# Patient Record
Sex: Male | Born: 1955 | Race: Black or African American | Hispanic: No | Marital: Single | Smoking: Never smoker
Health system: Southern US, Community
[De-identification: ages and names within clinical notes are randomized; demographics above are authoritative.]

## PROBLEM LIST (undated history)

## (undated) DIAGNOSIS — R768 Other specified abnormal immunological findings in serum: Secondary | ICD-10-CM

## (undated) DIAGNOSIS — B182 Chronic viral hepatitis C: Secondary | ICD-10-CM

## (undated) DIAGNOSIS — M1990 Unspecified osteoarthritis, unspecified site: Secondary | ICD-10-CM

## (undated) DIAGNOSIS — F1910 Other psychoactive substance abuse, uncomplicated: Secondary | ICD-10-CM

## (undated) HISTORY — DX: Unspecified osteoarthritis, unspecified site: M19.90

## (undated) HISTORY — PX: NO PAST SURGERIES: SHX2092

## (undated) HISTORY — DX: Other specified abnormal immunological findings in serum: R76.8

## (undated) HISTORY — DX: Chronic viral hepatitis C: B18.2

## (undated) HISTORY — DX: Other psychoactive substance abuse, uncomplicated: F19.10

---

## 2019-08-19 ENCOUNTER — Encounter: Payer: Self-pay | Admitting: Internal Medicine

## 2019-09-04 ENCOUNTER — Encounter: Payer: Self-pay | Admitting: Internal Medicine

## 2019-09-04 ENCOUNTER — Encounter: Payer: Self-pay | Admitting: Nurse Practitioner

## 2019-09-04 ENCOUNTER — Other Ambulatory Visit: Payer: Self-pay

## 2019-09-04 ENCOUNTER — Ambulatory Visit: Payer: Medicare PPO | Admitting: Nurse Practitioner

## 2019-09-04 DIAGNOSIS — K5900 Constipation, unspecified: Secondary | ICD-10-CM | POA: Diagnosis not present

## 2019-09-04 DIAGNOSIS — R768 Other specified abnormal immunological findings in serum: Secondary | ICD-10-CM | POA: Diagnosis not present

## 2019-09-04 NOTE — Progress Notes (Signed)
Primary Care Physician:  Emelda Fear, DO Primary Gastroenterologist:  Dr. Gala Romney  Chief Complaint  Patient presents with  . Hepatitis C  . Vomiting    HPI:   Cory Elliott is a 64 y.o. male who presents on referral from primary care for hepatitis C.  Reviewed information provided with referral including referral noting positive hepatitis C antibody.  Reviewed labs including CBC, CMP which were essentially normal other than mild transaminitis with AST/ALT elevated at 63/42.  Alk phos, bilirubin normal.  Hepatitis C antibody negative, hepatitis B core antibody and surface antigen both negative.  Hepatitis C antibody was positive.  Labs were marked for scanning.  No history of colonoscopy or endoscopy in our system.  No lab results in our system.  Today he states he states he just had a colonoscopy in [place], [place]. States he had "a test" with vomiting, which has improved. Has constipation and hard stools about every other day, often small stools. Not taking anything other than Milk of Magnesia. Did take Mag Citrate once. Denies other abdominal pain, N/V, hematochezia, melena, fever, chills, unintentional weight loss. Denies yellowing of skin/eyes, darkened urine, acute episodic confusion, generalized pruritis, tremors/shakes. Has taken 1-2 BC powders recently due to arthritis. Uses Ibuprofen "every now and then." Denies URI or flu-like symptoms. Denies loss of sense of taste or smell. He has received COVID-19 vaccine and has an appointment in 2 weeks for second dose. Denies chest pain, dyspnea, dizziness, lightheadedness, syncope, near syncope. Denies any other upper or lower GI symptoms.   Hepatitis C Risk Factors: Birth cohort ([place]): Yes IV drug use: No Tattoos: No Blood product transfusion: No HC worker: No Hemodialysis: No Maternal infection: No   Past Medical History:  Diagnosis Date  . Arthritis   . Hepatitis C antibody test positive   . Polysubstance abuse [facility])      Past Surgical History:  Procedure Laterality Date  . NO PAST SURGERIES      Current Outpatient Medications  Medication Sig Dispense Refill  . Aspirin-Caffeine (BC FAST PAIN RELIEF PO) Take by mouth as needed.    Marland Kitchen ibuprofen (ADVIL) 200 MG tablet Take 200 mg by mouth every 6 (six) hours as needed.    . tadalafil (CIALIS) 20 MG tablet Take 20 mg by mouth daily as needed for erectile dysfunction.     No current facility-administered medications for this visit.    Allergies as of 09/04/2019  . (No Known Allergies)    Family History  Problem Relation Age of Onset  . Colon cancer Neg Hx     Social History   Socioeconomic History  . Marital status: Single    Spouse name: Not on file  . Number of children: Not on file  . Years of education: Not on file  . Highest education level: Not on file  Occupational History  . Not on file  Tobacco Use  . Smoking status: Never Smoker  . Smokeless tobacco: Never Used  Substance and Sexual Activity  . Alcohol use: Yes    Comment: As of 09/04/19: 1-2 beers a week; previously more  . Drug use: Yes    Types: Marijuana, Cocaine    Comment: As of 09/04/19: 1-2 joints per day, cocaine 2-3 times per week  . Sexual activity: Not on file  Other Topics Concern  . Not on file  Social History Narrative  . Not on file   Social Determinants of Health   Financial Resource Strain:   .  Difficulty of Paying Living Expenses:   Food Insecurity:   . Worried About Charity fundraiser in the Last Year:   . Arboriculturist in the Last Year:   Transportation Needs:   . Film/video editor (Medical):   Marland Kitchen Lack of Transportation (Non-Medical):   Physical Activity:   . Days of Exercise per Week:   . Minutes of Exercise per Session:   Stress:   . Feeling of Stress :   Social Connections:   . Frequency of Communication with Friends and Family:   . Frequency of Social Gatherings with Friends and Family:   . Attends Religious Services:   . Active  Member of Clubs or Organizations:   . Attends Archivist Meetings:   Marland Kitchen Marital Status:   Intimate Partner Violence:   . Fear of Current or Ex-Partner:   . Emotionally Abused:   Marland Kitchen Physically Abused:   . Sexually Abused:     Subjective: Review of Systems  Constitutional: Negative for chills, fever, malaise/fatigue and weight loss.  HENT: Negative for congestion and sore throat.   Respiratory: Negative for cough and shortness of breath.   Cardiovascular: Negative for chest pain and palpitations.  Gastrointestinal: Negative for abdominal pain, blood in stool, diarrhea, melena, nausea and vomiting.  Musculoskeletal: Negative for joint pain and myalgias.  Skin: Negative for rash.  Neurological: Negative for dizziness and weakness.  Endo/Heme/Allergies: Does not bruise/bleed easily.  Psychiatric/Behavioral: Negative for depression. The patient is not nervous/anxious.   All other systems reviewed and are negative.      Objective: BP 140/87   Pulse 62   Temp (!) 97.3 F (36.3 C) (Oral)   Ht '5\' 7"'$  (1.702 m)   Wt 155 lb 12.8 oz (70.7 kg)   BMI 24.40 kg/m  Physical Exam Vitals and nursing note reviewed.  Constitutional:      General: He is not in acute distress.    Appearance: Normal appearance. He is not ill-appearing, toxic-appearing or diaphoretic.  HENT:     Head: Normocephalic and atraumatic.     Nose: No congestion or rhinorrhea.  Eyes:     General: No scleral icterus. Cardiovascular:     Rate and Rhythm: Normal rate and regular rhythm.     Heart sounds: Normal heart sounds.  Pulmonary:     Effort: Pulmonary effort is normal.     Breath sounds: Normal breath sounds.  Abdominal:     General: Bowel sounds are normal. There is no distension.     Palpations: Abdomen is soft. There is no hepatomegaly, splenomegaly or mass.     Tenderness: There is no abdominal tenderness. There is no guarding or rebound.     Hernia: No hernia is present.  Musculoskeletal:      Cervical back: Neck supple.  Skin:    General: Skin is warm and dry.     Coloration: Skin is not jaundiced.     Findings: No bruising or rash.  Neurological:     General: No focal deficit present.     Mental Status: He is alert and oriented to person, place, and time. Mental status is at baseline.  Psychiatric:        Mood and Affect: Mood normal.        Behavior: Behavior normal.        Thought Content: Thought content normal.       09/04/2019 2:36 PM   Disclaimer: This note was dictated with voice recognition software. Similar sounding  words can inadvertently be transcribed and may not be corrected upon review.

## 2019-09-04 NOTE — Assessment & Plan Note (Addendum)
The patient was referred by primary care for positive hepatitis C antibody.  Reviewed notes and labs from primary care.  Noted positive hepatitis C antibody but no genotype or RNA confirmation completed.  No ultrasound elastography.  Hepatitis a and B serologies were completed and are negative.  He has never been treated before.  I discussed the disease process of hepatitis C including pathophysiology, chances of success with oral treatments, and need to abstain from alcohol and drugs to the extent that it could cause "altered mental status" and result in forgetting a medication dose.  He verbalized understanding.  At this point I will complete serologic work-up including RNA confirmation, genotype, HIV serologies.  I will put in for abdominal ultrasound elastography to assess for fibrosis and/or cirrhosis.  At that point we can submit to insurance for possible coverage.  Follow-up in 6 weeks for further recommendations.  Hepatitis C Treatment Checklist  HCV RNA: ordered HCV GT: ordered Elastography: ordered Tx history: Never Treated Hepatitis A serologies: Yes, negative (see PCP labs marked for scanning) Hepatitis B serologies: Yes, negative (see PCP labs marked for scanning) HIV Serologies: ordered Contraindicated Medications: Assess at medication pickup, none currently Contraception: N/A

## 2019-09-04 NOTE — Patient Instructions (Signed)
Your health issues we discussed today were:   Mild constipation: 1. Start taking MiraLAX.  It is a powder that you mix into a drink of your choice and will dissolve to clear, odorless, tasteless. 2. Take MiraLAX once a day for regular bowel movements.  You can take a second dose daily, as needed, for any worsening constipation 3. Call us if you have any worsening or severe symptoms  Hepatitis C antibody positive: 1. Have your labs completed as soon as you can 2. We will help schedule your ultrasound for you 3. We will notify you of your results within 5 business days of them coming back 4. Further recommendations will follow, based on your results, as far as hepatitis C treatment 5. It is important that you work on abstaining from significant alcohol intake or drug use that could inhibit your decision making and result in missing a dose of medication  Overall I recommend:  1. Continue your other medications but try to avoid ibuprofen, Motrin, Advil, Aleve, naproxen, Naprosyn, aspirin, BC powders, Goody powders, and other "NSAIDs" that could result in worsening stomach problems 2. Return for follow-up in 6 weeks 3. Call us if you have any questions or concerns   ---------------------------------------------------------------  I am glad you got your vaccines!  Even though you are vaccinated you should continue to wear a mask, socially distance from people you do not live with, and wash your hands frequently  ---------------------------------------------------------------   At [facility] we value your feedback. You may receive a survey about your visit today. Please share your experience as we strive to create trusting relationships with our patients to provide genuine, compassionate, quality care.  We appreciate your understanding and patience as we review any laboratory studies, imaging, and other diagnostic tests that are ordered as we care for you. Our office policy is  5 business days for review of these results, and any emergent or urgent results are addressed in a timely manner for your best interest. If you do not hear from our office in 1 week, please contact us.   We also encourage the use of MyChart, which contains your medical information for your review as well. If you are not enrolled in this feature, an access code is on this after visit summary for your convenience. Thank you for allowing [place] to be involved in your care.  It was great to see you today!  I hope you have a great Summer!!

## 2019-09-04 NOTE — Assessment & Plan Note (Signed)
The patient notes mild constipation.  Has previously used mag citrate but does not want uses daily.  I recommended MiraLAX 1-2 times a day as needed for constipation.  Follow-up in 6 weeks.  Call for any worsening or severe symptoms.

## 2019-09-07 LAB — HIV ANTIBODY (ROUTINE TESTING W REFLEX): HIV 1&2 Ab, 4th Generation: NONREACTIVE

## 2019-09-07 LAB — HEPATITIS C GENOTYPE

## 2019-09-07 LAB — HEPATITIS C RNA QUANTITATIVE
HCV Quantitative Log: 6.02 Log IU/mL — ABNORMAL HIGH
HCV RNA, PCR, QN: 1040000 IU/mL — ABNORMAL HIGH

## 2019-09-09 ENCOUNTER — Ambulatory Visit (HOSPITAL_COMMUNITY)
Admission: RE | Admit: 2019-09-09 | Discharge: 2019-09-09 | Disposition: A | Discharge status: Home / Self Care | Payer: Medicare PPO | Source: Ambulatory Visit | Attending: Nurse Practitioner | Admitting: Nurse Practitioner

## 2019-09-09 ENCOUNTER — Other Ambulatory Visit: Payer: Self-pay

## 2019-09-09 DIAGNOSIS — R768 Other specified abnormal immunological findings in serum: Secondary | ICD-10-CM | POA: Diagnosis not present

## 2019-09-09 DIAGNOSIS — K5900 Constipation, unspecified: Secondary | ICD-10-CM | POA: Insufficient documentation

## 2019-09-19 ENCOUNTER — Telehealth: Payer: Self-pay

## 2019-09-19 NOTE — Telephone Encounter (Signed)
Approval letter was received from [facility] for Epclusa. Approval is good through 12/12/2019. Will notify pt when deliver is scheduled.

## 2019-09-24 NOTE — Telephone Encounter (Signed)
Patient received letter Cory Elliott was approved. He was checking to see if we have received it. I spoke with AM and it has not been received yet. Patient aware will call once shipment is received.

## 2019-09-24 NOTE — Telephone Encounter (Signed)
Noted  

## 2019-10-01 NOTE — Telephone Encounter (Signed)
Hep C medication has arrived. Please advise.  

## 2019-10-03 NOTE — Telephone Encounter (Signed)
Ok to have patient pick up medications and sign agreement as per below. No needed medications changes.    How to take Epclusa: 1. Do not stop taking Epclusa without first notifying us to discuss why you are wanting to stop taking Epclusa. 2. Take 1 Epclusa tablet, once a day, at the same time every day. 3. Your can take Epclusa with or without food. 4. Do not miss or skip any doses of Epclusa during treatment. 5. If you take too much Epclusa, notify us or go to the nearest emergency room.  DO NOT start any new medications, whether prescription or over-the-counter, no herbs, supplements, any other treatments before checking with [place] first.  Please have the patient sign the medication acknowledgement form when he comes to pick up his Epclusa.  Please schedule 4 week follow-up visit for labs and updated OV.

## 2019-10-03 NOTE — Telephone Encounter (Signed)
Spoke with pt. Pt will get medication at his 10/17/19 apt.

## 2019-10-17 ENCOUNTER — Other Ambulatory Visit: Payer: Self-pay

## 2019-10-17 ENCOUNTER — Ambulatory Visit (INDEPENDENT_AMBULATORY_CARE_PROVIDER_SITE_OTHER): Payer: Medicare PPO | Admitting: Nurse Practitioner

## 2019-10-17 ENCOUNTER — Encounter: Payer: Self-pay | Admitting: Nurse Practitioner

## 2019-10-17 VITALS — BP 160/95 | HR 64 | Temp 97.5°F | Ht 67.0 in | Wt 153.0 lb

## 2019-10-17 DIAGNOSIS — B182 Chronic viral hepatitis C: Secondary | ICD-10-CM | POA: Diagnosis not present

## 2019-10-17 DIAGNOSIS — K5900 Constipation, unspecified: Secondary | ICD-10-CM | POA: Diagnosis not present

## 2019-10-17 NOTE — Assessment & Plan Note (Addendum)
Completed work-up for hepatitis C (as noted below).  Cory Elliott has been shipped to our office.  We will give him his instructions for Epclusa as well as have him sign paperwork agreeing to not start any new medicines without checking with [place] first.  I will put in for CBC, CMP, hepatitis C RNA in 1 month.  I will request him be placed on the recall list to remind him to have his labs drawn in 1 month.  Plan for office visit follow-up in 3 months to include follow-up visit and labs.  I spent an extensive amount of time educating him on Epclusa and the need to call us for any questions, concerns, problems.  He verbalized understanding.  He is aware that insurance will likely not pay for a second course of treatment if he does not follow instructions.  I also advised him to refrain from marijuana, he is making his best effort at this time.  Hepatitis C Treatment Checklist  HCV RNA: 1,040,000 copies HCV GT: 1a Elastography: <5 kPa (high probability of normal) Tx history: Never Treated Hepatitis A serologies: Yes, negative (see PCP labs marked for scanning) Hepatitis B serologies: Yes, negative (see PCP labs marked for scanning) HIV Serologies: negative Contraindicated Medications: NONE (verified with UpToDate interaction program) Contraception: N/A

## 2019-10-17 NOTE — Assessment & Plan Note (Signed)
Constipation currently doing well.  He only gets constipated, per his description, if he does not drink enough water.  Recommend he drink at least 48 ounces of water daily.  Drink more water if he is going to be working in the sun.  Continue to use MiraLAX as needed for constipation.  Call for any worsening or severe symptoms.

## 2019-10-17 NOTE — Progress Notes (Signed)
Referring Provider: Lorelei Pont, DO Primary Care Physician:  Lorelei Pont, DO Primary GI:  Dr. Jena Gauss  Chief Complaint  Patient presents with  . Hepatitis C    stomach pain,fatigue    HPI:   Cory Elliott is a 64 y.o. male who presents for hepatitis C follow-up.  The patient was last seen in our office 09/04/2019 for hepatitis C and constipation.  Mildly elevated LFTs on PCP referral with AST/ALT elevated at 63/42, alkaline phosphatase and bilirubin normal.  Hepatitis C antibody positive, hepatitis B serologies negative.  At his last visit was previously having vomiting but this is improved.  Now constipation hard stools with a bowel movement every other day, only taking milk of magnesia.  Tried mag citrate but did not help.  No overt hepatic symptoms.  Notes 1-2 BC powders recently due to arthritis as well as ibuprofen "every now and then".  No other overt GI complaints.  Recommended updated labs for hepatitis C treatment options, right upper quadrant ultrasound elastography, MiraLAX daily or up to twice a day.  He was very prompt in obtaining his labs which confirmed hepatitis C infection with just over 1 million copies of RNA, genotype 1a, HIV negative.  Right upper quadrant ultrasound elastography without suggestion of cirrhosis.  Elastography less than 5K PA (high probability of being normal).  Prior Auth was initiated for [place] and approved.  This was shipped to our office and he will pick up his medication today.  Today he states he's feeling a bit bad today because he was out working in the sun a lot today. Has given up Marijuana a week ago. Constipation is somewhat improved. Has a bowel movement sometimes once a day, drinks minimal water. When he doesn't drink "at least 2-3 things of water my stools are hard." Denies ongoing abdominal pain. Has occasional cramp right ribs if he moves a certain way. Sometimes has a cramp in his right neck as well. Denies esophageal burning.  Denies N/V, hematochezia, melena, fever, chills, unintentional weight loss. Denies URI or flu-like symptoms. Denies loss of sense of taste or smell. The patient has received COVID-19 vaccination(s). Denies chest pain, dyspnea, dizziness, lightheadedness, syncope, near syncope. Denies any other upper or lower GI symptoms.  Took 2 BC powders yesterday due to arthritis pain (Tylenol didn't help); otherwise he hasn't had any ASA powders or NSAIDs recently.  A minimum of 25 min was spent on the patient's care today  Past Medical History:  Diagnosis Date  . Arthritis   . Chronic hepatitis C (HCC)   . Hepatitis C antibody test positive   . Polysubstance abuse [facility])     Past Surgical History:  Procedure Laterality Date  . NO PAST SURGERIES      Current Outpatient Medications  Medication Sig Dispense Refill  . Aspirin-Caffeine (BC FAST PAIN RELIEF PO) Take by mouth as needed.    Marland Kitchen ibuprofen (ADVIL) 200 MG tablet Take 200 mg by mouth as needed.     . tadalafil (CIALIS) 20 MG tablet Take 20 mg by mouth as needed for erectile dysfunction.      No current facility-administered medications for this visit.    Allergies as of 10/17/2019  . (No Known Allergies)    Family History  Problem Relation Age of Onset  . Colon cancer Neg Hx     Social History   Socioeconomic History  . Marital status: Single    Spouse name: Not on file  . Number of children: Not  on file  . Years of education: Not on file  . Highest education level: Not on file  Occupational History  . Not on file  Tobacco Use  . Smoking status: Never Smoker  . Smokeless tobacco: Never Used  Substance and Sexual Activity  . Alcohol use: Yes    Comment: As of 09/04/19: 1-2 beers a week; previously more  . Drug use: Not Currently    Types: Marijuana, Cocaine    Comment: Quit 2 wks ago as of  10/17/19, As of 09/04/19: 1-2 joints per day, cocaine 2-3 times per week  . Sexual activity: Not on file  Other Topics Concern  . Not  on file  Social History Narrative  . Not on file   Social Determinants of Health   Financial Resource Strain:   . Difficulty of Paying Living Expenses:   Food Insecurity:   . Worried About Charity fundraiser in the Last Year:   . Arboriculturist in the Last Year:   Transportation Needs:   . Film/video editor (Medical):   Marland Kitchen Lack of Transportation (Non-Medical):   Physical Activity:   . Days of Exercise per Week:   . Minutes of Exercise per Session:   Stress:   . Feeling of Stress :   Social Connections:   . Frequency of Communication with Friends and Family:   . Frequency of Social Gatherings with Friends and Family:   . Attends Religious Services:   . Active Member of Clubs or Organizations:   . Attends Archivist Meetings:   Marland Kitchen Marital Status:     Subjective: Review of Systems  Constitutional: Negative for chills, fever, malaise/fatigue and weight loss.  HENT: Negative for congestion and sore throat.   Respiratory: Negative for cough and shortness of breath.   Cardiovascular: Negative for chest pain and palpitations.  Gastrointestinal: Positive for constipation (mild if doesn't drink enough water). Negative for abdominal pain, blood in stool, diarrhea, melena, nausea and vomiting.  Musculoskeletal: Negative for joint pain and myalgias.  Skin: Negative for rash.  Neurological: Negative for dizziness and weakness.  Endo/Heme/Allergies: Does not bruise/bleed easily.  Psychiatric/Behavioral: Negative for depression. The patient is not nervous/anxious.   All other systems reviewed and are negative.    Objective: BP (!) 160/95   Pulse 64   Temp (!) 97.5 F (36.4 C) (Temporal)   Ht 5\' 7"  (1.702 m)   Wt 153 lb (69.4 kg)   BMI 23.96 kg/m  Physical Exam Vitals and nursing note reviewed.  Constitutional:      General: He is not in acute distress.    Appearance: Normal appearance. He is not ill-appearing, toxic-appearing or diaphoretic.  HENT:     Head:  Normocephalic and atraumatic.     Nose: No congestion or rhinorrhea.  Eyes:     General: No scleral icterus. Cardiovascular:     Rate and Rhythm: Normal rate and regular rhythm.     Heart sounds: Normal heart sounds.  Pulmonary:     Effort: Pulmonary effort is normal.     Breath sounds: Normal breath sounds.  Abdominal:     General: Bowel sounds are normal. There is no distension.     Palpations: Abdomen is soft. There is no hepatomegaly, splenomegaly or mass.     Tenderness: There is no abdominal tenderness. There is no guarding or rebound.     Hernia: No hernia is present.  Musculoskeletal:     Cervical back: Neck supple.  Skin:  General: Skin is warm and dry.     Coloration: Skin is not jaundiced.     Findings: No bruising or rash.  Neurological:     General: No focal deficit present.     Mental Status: He is alert and oriented to person, place, and time. Mental status is at baseline.  Psychiatric:        Mood and Affect: Mood normal.        Behavior: Behavior normal.        Thought Content: Thought content normal.       10/17/2019 2:53 PM   Disclaimer: This note was dictated with voice recognition software. Similar sounding words can inadvertently be transcribed and may not be corrected upon review.

## 2019-10-17 NOTE — Patient Instructions (Addendum)
Your health issues we discussed today were:   Constipation: 1. Aim to drink at least 48 ounces of water a day 2. You can continue to use MiraLAX 1-2 times a day as needed for constipation 3. Call us if you have any worsening or severe symptoms  Hepatitis C: 1. We will give you your Epclusa medication.  We will need you to sign paperwork agreement not to take any new medicines (over-the-counter, prescription, herbs, supplements, ANY medications) without checking with [place] first. 2. Take Epclusa once a day, with or without food, at the same time every day. See your instructions below 3. If you have any problems or side effects call us 4. If you take more than 1 dose in a day, call us 5. I have put in lab test that you will need to have completed in 1 month.  We will try to call and remind you to have these completed, but it is your responsibility to have your labs done 6. Call us for any questions or problems  Overall I recommend:  1. Continue your other current medications 2. Return for follow-up in 3 months 3. Call us if you have any questions or concerns.  ---------------------------------------------------------------  How to take Epclusa: 1. Do not stop taking Epclusa without first notifying us to discuss why you are wanting to stop taking Epclusa. 2. Take 1 Epclusa tablet, once a day, at the same time every day. 3. Your can take Epclusa with or without food. 4. Do not miss or skip any doses of Epclusa during treatment. 5. If you take too much Epclusa, notify us or go to the nearest emergency room.  DO NOT start any new medications, whether prescription or over-the-counter, no herbs, supplements, any other treatments before checking with [place] first.  ---------------------------------------------------------------  I am glad you have gotten your COVID-19 vaccination!  Even though you are fully vaccinated you should continue to follow CDC and state/local  guidelines.  ---------------------------------------------------------------   At [facility] we value your feedback. You may receive a survey about your visit today. Please share your experience as we strive to create trusting relationships with our patients to provide genuine, compassionate, quality care.  We appreciate your understanding and patience as we review any laboratory studies, imaging, and other diagnostic tests that are ordered as we care for you. Our office policy is 5 business days for review of these results, and any emergent or urgent results are addressed in a timely manner for your best interest. If you do not hear from our office in 1 week, please contact us.   We also encourage the use of MyChart, which contains your medical information for your review as well. If you are not enrolled in this feature, an access code is on this after visit summary for your convenience. Thank you for allowing [place] to be involved in your care.  It was great to see you today!  I hope you have a great Summer!!

## 2019-10-22 ENCOUNTER — Telehealth: Payer: Self-pay | Admitting: Internal Medicine

## 2019-10-22 NOTE — Telephone Encounter (Signed)
Patient called and said that his prescriptions were not sent in after his appointment last week

## 2019-10-22 NOTE — Telephone Encounter (Signed)
Lmom for pt to call us back. 

## 2019-10-23 NOTE — Telephone Encounter (Signed)
Pt wants to know if it is safe to take Tadalafil and Ibuprofen while he is taking Epclusa.  If he can't take Ibuprofen, he wants to know what he can take for pain.

## 2019-10-23 NOTE — Telephone Encounter (Signed)
These should not have any interactions. Thank him for calling to check!!!

## 2019-10-24 NOTE — Telephone Encounter (Signed)
Called pt and made him aware that Tadalafil and Ibuprofen should not have any interactions with Epclusa per EG.  Pt voiced understanding.

## 2020-01-21 ENCOUNTER — Other Ambulatory Visit: Payer: Self-pay

## 2020-01-21 ENCOUNTER — Ambulatory Visit: Payer: Medicare PPO | Admitting: Nurse Practitioner

## 2020-01-21 ENCOUNTER — Encounter: Payer: Self-pay | Admitting: Nurse Practitioner

## 2020-01-21 VITALS — BP 149/92 | HR 54 | Temp 97.5°F | Ht 66.0 in | Wt 157.4 lb

## 2020-01-21 DIAGNOSIS — B182 Chronic viral hepatitis C: Secondary | ICD-10-CM

## 2020-01-21 DIAGNOSIS — K5900 Constipation, unspecified: Secondary | ICD-10-CM

## 2020-01-21 NOTE — Patient Instructions (Signed)
Your health issues we discussed today were:   Constipation: 1. Continue using your stool softener as needed 2. Let us know if you have any worsening constipation or rectal bleeding  Chronic hepatitis C: 1. Congratulations on finishing your treatment! 2. Have your labs drawn today 3. We will have you follow-up in 3 months to recheck your labs in order to document "cure" which is defined as no viral genetic material present 3 months (12 weeks) after finishing treatment 4. Let us know if you have any recurrent symptoms  Overall I recommend:  1. Continue your other current medications 2. Return for follow-up in 3 months 3. Call us for any questions or concerns   ---------------------------------------------------------------  I am glad you have gotten your COVID-19 vaccination!  Even though you are fully vaccinated you should continue to follow CDC and state/local guidelines.  ---------------------------------------------------------------   At [facility] we value your feedback. You may receive a survey about your visit today. Please share your experience as we strive to create trusting relationships with our patients to provide genuine, compassionate, quality care.  We appreciate your understanding and patience as we review any laboratory studies, imaging, and other diagnostic tests that are ordered as we care for you. Our office policy is 5 business days for review of these results, and any emergent or urgent results are addressed in a timely manner for your best interest. If you do not hear from our office in 1 week, please contact us.   We also encourage the use of MyChart, which contains your medical information for your review as well. If you are not enrolled in this feature, an access code is on this after visit summary for your convenience. Thank you for allowing [place] to be involved in your care.  It was great to see you today!  I hope you have a great rest of your  summer!!

## 2020-01-21 NOTE — Progress Notes (Signed)
Referring Provider: Lorelei Pont, DO Primary Care Physician:  Lorelei Pont, DO Primary GI:  Dr. Jena Gauss   Chief Complaint  Patient presents with  . Hepatitis C    finished Epclusa-doing ok  . Constipation    occ    HPI:   Cory Elliott is a 65 y.o. male who presents for follow-up on hepatitis C.  The patient was last seen in our office 10/17/2019 for chronic hepatitis C and constipation.  Referred by primary care for hep C positive antibody with mild transaminitis.  Noted hepatitis B and HIV serologies negative.  Hepatitis C RNA confirmed with genotype 1a.  Right upper quadrant ultrasound with elastography noted to be less than 5 kPa (high probability of being normal).  We submitted prior Auth request for Epclusa which was approved and shipped to our office.  At his last visit he noted he was doing okay overall, but with some symptoms of feeling "not great" due to working out in the sun a lot.  Constipation somewhat improved with daily bowel movement, although he drinks minimal water.  No other overt GI complaints.  No NSAIDs other than 2 BC powders today prior due to arthritis pain.  Recommended minimum 48 ounces of water a day, more if will be in the sun.  Continue to use MiraLAX 1-2 times a day.  Start taking Epclusa once a day, with or without food.  Further instructions were provided.  Call for any concerns.  Recommended repeat labs in 1 month.  Follow-up in 3 months.  It does not appear that follow-up labs (CBC, CMP, hepatitis C RNA) scheduled for end of June were completed.  Today he states he is doing okay overall. He finished his Epclusa around 2 weeks ago. He tolerated the medication, didn't miss any doses. Denies abdominal pain, N/V, hematochezia, melena, fever, chills, unintentional weight loss. Denies URI or flu-like symptoms. Denies loss of sense of taste or smell. The patient has received COVID-19 vaccination(s). Denies chest pain, dyspnea, dizziness, lightheadedness, syncope,  near syncope. Denies any other upper or lower GI symptoms.  He notes occasional constipation, uses a stool softener occasionally (about once a week) and this resolves any symptoms he may be having.  Past Medical History:  Diagnosis Date  . Arthritis   . Chronic hepatitis C (HCC)   . Hepatitis C antibody test positive   . Polysubstance abuse [facility])     Past Surgical History:  Procedure Laterality Date  . NO PAST SURGERIES      Current Outpatient Medications  Medication Sig Dispense Refill  . ibuprofen (ADVIL) 200 MG tablet Take 200 mg by mouth as needed.     . tadalafil (CIALIS) 20 MG tablet Take 20 mg by mouth as needed for erectile dysfunction.      No current facility-administered medications for this visit.    Allergies as of 01/21/2020  . (No Known Allergies)    Family History  Problem Relation Age of Onset  . Colon cancer Neg Hx     Social History   Socioeconomic History  . Marital status: Single    Spouse name: Not on file  . Number of children: Not on file  . Years of education: Not on file  . Highest education level: Not on file  Occupational History  . Not on file  Tobacco Use  . Smoking status: Never Smoker  . Smokeless tobacco: Never Used  Substance and Sexual Activity  . Alcohol use: Yes    Comment: 01/21/20  1-2 beer/day  . Drug use: Yes    Frequency: 2.0 times per week    Types: Marijuana, Cocaine    Comment: 01/21/20: marijuana twice a week; occasional cocaine (once every 2-3 weeks)  . Sexual activity: Not on file  Other Topics Concern  . Not on file  Social History Narrative  . Not on file   Social Determinants of Health   Financial Resource Strain:   . Difficulty of Paying Living Expenses: Not on file  Food Insecurity:   . Worried About Programme researcher, broadcasting/film/video in the Last Year: Not on file  . Ran Out of Food in the Last Year: Not on file  Transportation Needs:   . Lack of Transportation (Medical): Not on file  . Lack of Transportation  (Non-Medical): Not on file  Physical Activity:   . Days of Exercise per Week: Not on file  . Minutes of Exercise per Session: Not on file  Stress:   . Feeling of Stress : Not on file  Social Connections:   . Frequency of Communication with Friends and Family: Not on file  . Frequency of Social Gatherings with Friends and Family: Not on file  . Attends Religious Services: Not on file  . Active Member of Clubs or Organizations: Not on file  . Attends Banker Meetings: Not on file  . Marital Status: Not on file    Subjective: Review of Systems  Constitutional: Negative for chills, fever, malaise/fatigue and weight loss.  HENT: Negative for congestion and sore throat.   Respiratory: Negative for cough and shortness of breath.   Cardiovascular: Negative for chest pain and palpitations.  Gastrointestinal: Negative for abdominal pain, blood in stool, diarrhea, melena, nausea and vomiting.  Musculoskeletal: Negative for joint pain and myalgias.  Skin: Negative for rash.  Neurological: Negative for dizziness and weakness.  Endo/Heme/Allergies: Does not bruise/bleed easily.  Psychiatric/Behavioral: Negative for depression. The patient is not nervous/anxious.   All other systems reviewed and are negative.    Objective: BP (!) 149/92   Pulse (!) 54   Temp (!) 97.5 F (36.4 C) (Temporal)   Ht 5\' 6"  (1.676 m)   Wt 157 lb 6.4 oz (71.4 kg)   BMI 25.41 kg/m  Physical Exam Vitals and nursing note reviewed.  Constitutional:      General: He is not in acute distress.    Appearance: Normal appearance. He is not ill-appearing, toxic-appearing or diaphoretic.  HENT:     Head: Normocephalic and atraumatic.     Nose: No congestion or rhinorrhea.  Eyes:     General: No scleral icterus. Cardiovascular:     Rate and Rhythm: Normal rate and regular rhythm.     Heart sounds: Normal heart sounds.  Pulmonary:     Effort: Pulmonary effort is normal.     Breath sounds: Normal breath  sounds.  Abdominal:     General: Bowel sounds are normal. There is no distension.     Palpations: Abdomen is soft. There is no hepatomegaly, splenomegaly or mass.     Tenderness: There is no abdominal tenderness. There is no guarding or rebound.     Hernia: No hernia is present.  Musculoskeletal:     Cervical back: Neck supple.  Skin:    General: Skin is warm and dry.     Coloration: Skin is not jaundiced.     Findings: No bruising or rash.  Neurological:     General: No focal deficit present.  Mental Status: He is alert and oriented to person, place, and time. Mental status is at baseline.  Psychiatric:        Mood and Affect: Mood normal.        Behavior: Behavior normal.        Thought Content: Thought content normal.      Assessment:  Very pleasant 64 year old male with chronic hepatitis C which is completed Epclusa treatment.  He states he took all of his medications, no side effects, but not miss a dose.  Unfortunately he did not have his 4-week post treatment initiation labs.  I will have them check end of treatment labs today.  I will bring him back in 3 months to touch base and check labs to document SVR-12.  His initial ultrasound elastography was unremarkable as per HPI so I do not think that a repeat ultrasound is necessary at this time.  He does continue to use drugs and we have had a good discussion about the need to reduce and stop using recreational drugs.  He verbalized understanding.  On his chief complaint today he also mention mild constipation.  On further discussion he typically requires a stool softener 1-2 times a week which is effective for him.  He does not feel he needs any other treatment.  I recommend he continue this.  Call for any worsening symptoms.   Plan: 1. Have labs drawn today: CBC, CMP, HCVRNA 2. Continue other current medications 3. Continue stool softener as needed for mild, intermittent constipation 4. Notify us of any worsening symptoms,  rectal bleeding, recalcitrant constipation. 5. Return for follow-up in 3 months for evaluation and to update labs/document SVR-12   Thank you for allowing [place] to participate in the care of Cory Fabio Neighbors, DNP, AGNP-C Adult & Gerontological Nurse Practitioner [facility]   01/21/2020 3:16 PM   Disclaimer: This note was dictated with voice recognition software. Similar sounding words can inadvertently be transcribed and may not be corrected upon review.

## 2020-01-27 LAB — COMPREHENSIVE METABOLIC PANEL
AG Ratio: 1.2 (calc) (ref 1.0–2.5)
ALT: 17 U/L (ref 9–46)
AST: 28 U/L (ref 10–35)
Albumin: 4.6 g/dL (ref 3.6–5.1)
Alkaline phosphatase (APISO): 82 U/L (ref 35–144)
BUN: 17 mg/dL (ref 7–25)
CO2: 27 mmol/L (ref 20–32)
Calcium: 9.6 mg/dL (ref 8.6–10.3)
Chloride: 103 mmol/L (ref 98–110)
Creat: 1.12 mg/dL (ref 0.70–1.25)
Globulin: 3.8 g/dL (calc) — ABNORMAL HIGH (ref 1.9–3.7)
Glucose, Bld: 83 mg/dL (ref 65–99)
Potassium: 4.2 mmol/L (ref 3.5–5.3)
Sodium: 138 mmol/L (ref 135–146)
Total Bilirubin: 0.6 mg/dL (ref 0.2–1.2)
Total Protein: 8.4 g/dL — ABNORMAL HIGH (ref 6.1–8.1)

## 2020-01-27 LAB — CBC WITH DIFFERENTIAL/PLATELET
Absolute Monocytes: 473 cells/uL (ref 200–950)
Basophils Absolute: 52 cells/uL (ref 0–200)
Basophils Relative: 1 %
Eosinophils Absolute: 218 cells/uL (ref 15–500)
Eosinophils Relative: 4.2 %
HCT: 35 % — ABNORMAL LOW (ref 38.5–50.0)
Hemoglobin: 11.6 g/dL — ABNORMAL LOW (ref 13.2–17.1)
Lymphs Abs: 2673 cells/uL (ref 850–3900)
MCH: 28.1 pg (ref 27.0–33.0)
MCHC: 33.1 g/dL (ref 32.0–36.0)
MCV: 84.7 fL (ref 80.0–100.0)
MPV: 11.6 fL (ref 7.5–12.5)
Monocytes Relative: 9.1 %
Neutro Abs: 1784 cells/uL (ref 1500–7800)
Neutrophils Relative %: 34.3 %
Platelets: 222 10*3/uL (ref 140–400)
RBC: 4.13 10*6/uL — ABNORMAL LOW (ref 4.20–5.80)
RDW: 14.1 % (ref 11.0–15.0)
Total Lymphocyte: 51.4 %
WBC: 5.2 10*3/uL (ref 3.8–10.8)

## 2020-01-27 LAB — HCV RNA, QUANT REAL-TIME PCR W/REFLEX
HCV RNA, PCR, QN (Log): <1.18 LogIU/mL
HCV RNA, PCR, QN: <15 IU/mL

## 2020-02-14 ENCOUNTER — Telehealth: Payer: Self-pay | Admitting: Internal Medicine

## 2020-02-14 NOTE — Telephone Encounter (Signed)
Spoke with pt. Pt states at his last apt, he was given a written prescription that he took to his doctor to prescribe. Pt says the medication is too expensive and his doctor asked him to call our office to see if there was anything else, he could try. I didn't see a prescription listed. Wynne Dust, PA, do you recall the name of the prescription pt is referring to?

## 2020-02-14 NOTE — Telephone Encounter (Signed)
Patient called and said the medication we sent in for his is very expensive, he needs an alternative

## 2020-02-14 NOTE — Telephone Encounter (Signed)
I dont see any new Rx from his OV a month ago (and his active med list is only Cialis and Ibuprofen, neither of which we prescribe). Did he possibly get it from another provider?

## 2020-02-14 NOTE — Telephone Encounter (Signed)
Lmom, waiting on a return call.  

## 2020-02-19 NOTE — Telephone Encounter (Signed)
Spoke with pt and he has been in contact with his PCP about the medication.

## 2020-04-22 ENCOUNTER — Ambulatory Visit: Payer: Medicare PPO | Admitting: Nurse Practitioner

## 2020-06-03 ENCOUNTER — Other Ambulatory Visit: Payer: Self-pay

## 2020-06-03 ENCOUNTER — Encounter: Payer: Self-pay | Admitting: Nurse Practitioner

## 2020-06-03 ENCOUNTER — Ambulatory Visit: Payer: Medicare PPO | Admitting: Nurse Practitioner

## 2020-06-03 VITALS — BP 145/78 | HR 80 | Temp 97.3°F | Ht 66.0 in | Wt 162.4 lb

## 2020-06-03 DIAGNOSIS — B182 Chronic viral hepatitis C: Secondary | ICD-10-CM

## 2020-06-03 DIAGNOSIS — K5900 Constipation, unspecified: Secondary | ICD-10-CM

## 2020-06-03 NOTE — Progress Notes (Signed)
Referring Provider: Lorelei Pont, DO Primary Care Physician:  Lorelei Pont, DO Primary GI:  Dr. Jena Gauss  Chief Complaint  Patient presents with  . Hepatitis C    F/u  . Constipation    Bowels are doing good    HPI:   Cory Elliott is a 65 y.o. male who presents for follow-up on hepatitis C and constipation.  The patient was last seen in our office 01/21/2020 for the same.  Initially referred for positive hepatitis C with mild transaminitis found to be genotype Ia with normal liver elastography.  At his last visit he noted he finished Epclusa 2 weeks prior to his last office visit.  He notes he tolerated the medicine well and did not miss any doses.  No overt GI or hepatic complaints other than occasional constipation for which he uses a stool softener (about once a week) which resolves his symptoms.  Recommended continue stool softener as needed, complete labs for end of treatment.  Schedule follow-up in 3 months to reevaluate and recheck labs, call if any problems.  Labs completed 01/21/2020 including CBC which shows mild anemia with a hemoglobin of 11.6 (baseline not available), CMP now with normalized LFTs, hepatitis C RNA not detected.  He had a 73-month follow-up scheduled for 04/22/2020, but this was canceled.  Today states he is doing okay overall. He is generally having a bowel movement every day. Occasionally goes every other day when he'll take Milk of Magnesia which works well. Overall he is satisfied with his results. Denies abdominal pain, N/V, hematochezia, melena, fever, chills, unintentional weight loss. Denies any yellowing of the skin/eyes, darkened urine, acute episodic confusion, generalized tremors, generalized pruritis. Denies URI or flu-like symptoms. Denies loss of sense of taste or smell. The patient has received COVID-19 vaccination(s). Denies chest pain, dyspnea, dizziness, lightheadedness, syncope, near syncope. Denies any other upper or lower GI symptoms.  Past  Medical History:  Diagnosis Date  . Arthritis   . Chronic hepatitis C (HCC)   . Hepatitis C antibody test positive   . Polysubstance abuse [facility])     Past Surgical History:  Procedure Laterality Date  . NO PAST SURGERIES      Current Outpatient Medications  Medication Sig Dispense Refill  . diclofenac (VOLTAREN) 75 MG EC tablet Take 75 mg by mouth 2 (two) times daily.    . tadalafil (CIALIS) 20 MG tablet Take 20 mg by mouth as needed for erectile dysfunction.     Marland Kitchen ibuprofen (ADVIL) 200 MG tablet Take 200 mg by mouth as needed.  (Patient not taking: Reported on 06/03/2020)     No current facility-administered medications for this visit.    Allergies as of 06/03/2020  . (No Known Allergies)    Family History  Problem Relation Age of Onset  . Colon cancer Neg Hx     Social History   Socioeconomic History  . Marital status: Single    Spouse name: Not on file  . Number of children: Not on file  . Years of education: Not on file  . Highest education level: Not on file  Occupational History  . Not on file  Tobacco Use  . Smoking status: Never Smoker  . Smokeless tobacco: Never Used  Substance and Sexual Activity  . Alcohol use: Yes    Comment: 01/21/20 1-2 beer/day  . Drug use: Yes    Frequency: 2.0 times per week    Types: Marijuana, Cocaine    Comment: 01/21/20: marijuana twice a  week; occasional cocaine (once every 2-3 weeks)  . Sexual activity: Not on file  Other Topics Concern  . Not on file  Social History Narrative  . Not on file   Social Determinants of Health   Financial Resource Strain: Not on file  Food Insecurity: Not on file  Transportation Needs: Not on file  Physical Activity: Not on file  Stress: Not on file  Social Connections: Not on file    Subjective: Review of Systems  Constitutional: Negative for chills, fever, malaise/fatigue and weight loss.  HENT: Negative for congestion and sore throat.   Respiratory: Negative for cough and  shortness of breath.   Cardiovascular: Negative for chest pain and palpitations.  Gastrointestinal: Negative for abdominal pain, blood in stool, constipation, diarrhea, melena, nausea and vomiting.  Musculoskeletal: Negative for joint pain and myalgias.  Skin: Negative for rash.  Neurological: Negative for dizziness and weakness.  Endo/Heme/Allergies: Does not bruise/bleed easily.  Psychiatric/Behavioral: Negative for depression. The patient is not nervous/anxious.   All other systems reviewed and are negative.    Objective: BP (!) 145/78   Pulse 80   Temp (!) 97.3 F (36.3 C)   Ht 5\' 6"  (1.676 m)   Wt 162 lb 6.4 oz (73.7 kg)   BMI 26.21 kg/m  Physical Exam Vitals and nursing note reviewed.  Constitutional:      General: He is not in acute distress.    Appearance: Normal appearance. He is normal weight. He is not ill-appearing, toxic-appearing or diaphoretic.  HENT:     Head: Normocephalic and atraumatic.     Nose: No congestion or rhinorrhea.  Eyes:     General: No scleral icterus. Cardiovascular:     Rate and Rhythm: Normal rate and regular rhythm.     Heart sounds: Normal heart sounds.  Pulmonary:     Effort: Pulmonary effort is normal.     Breath sounds: Normal breath sounds.  Abdominal:     General: Bowel sounds are normal. There is no distension.     Palpations: Abdomen is soft. There is no hepatomegaly, splenomegaly or mass.     Tenderness: There is no abdominal tenderness. There is no guarding or rebound.     Hernia: No hernia is present.  Musculoskeletal:     Cervical back: Neck supple.  Skin:    General: Skin is warm and dry.     Coloration: Skin is not jaundiced.     Findings: No bruising or rash.  Neurological:     General: No focal deficit present.     Mental Status: He is alert and oriented to person, place, and time. Mental status is at baseline.  Psychiatric:        Mood and Affect: Mood normal.        Behavior: Behavior normal.        Thought  Content: Thought content normal.      Assessment:  Very pleasant 65 year old male presents for follow-up on constipation and hepatitis C.  Overall doing well.  Constipation: Significantly improved, generally has a bowel movement once a day or once every other day.  Rarely he will require something to help at which point he will take milk of magnesia which works well.  Recommend he continue his current medications and notify 79 of any worsening  Chronic hepatitis C: It is now been 3 to 4 months since he has completed his hepatitis C treatment with Epclusa.  His posttreatment hepatitis C RNA was undetectable.  At this point  I will check a CBC, CMP, repeat hepatitis C RNA to document sustained virologic response and verify his LFTs have remained normal.  We discussed that he can become reinfected and recommended he avoid IV drugs and other high risk behaviors.  He verbalized understanding.   Plan: 1. Continue current medications 2. CBC, CMP, HCVRNA 3. Further recommendations to follow 4. Follow-up in 6 months    Thank you for allowing [place] to participate in the care of Cory Fabio Neighbors, DNP, AGNP-C Adult & Gerontological Nurse Practitioner [facility]   06/03/2020 2:13 PM   Disclaimer: This note was dictated with voice recognition software. Similar sounding words can inadvertently be transcribed and may not be corrected upon review.

## 2020-06-03 NOTE — Patient Instructions (Signed)
Your health issues we discussed today were:   Constipation: 1. I am glad you are doing better! 2. Continue to use milk of magnesia, if needed, for occasional constipation 3. Let us know give any worsening or severe symptoms  Hepatitis C infection: 1. I am glad you have successfully completed your medication excavation point 2. Have your labs drawn as soon as you can 3. We will call you when we get the results 4. If you are hepatitis C test comes back negative then you are officially "cured" 5. We will call you with results  Overall I recommend:  1. Continue other current medications 2. Return for follow-up in 6 months 3. Call prescription questions or concerns   ---------------------------------------------------------------  I am glad you have gotten your COVID-19 vaccination!  Even though you are fully vaccinated you should continue to follow CDC and state/local guidelines.  ---------------------------------------------------------------   At [facility] we value your feedback. You may receive a survey about your visit today. Please share your experience as we strive to create trusting relationships with our patients to provide genuine, compassionate, quality care.  We appreciate your understanding and patience as we review any laboratory studies, imaging, and other diagnostic tests that are ordered as we care for you. Our office policy is 5 business days for review of these results, and any emergent or urgent results are addressed in a timely manner for your best interest. If you do not hear from our office in 1 week, please contact us.   We also encourage the use of MyChart, which contains your medical information for your review as well. If you are not enrolled in this feature, an access code is on this after visit summary for your convenience. Thank you for allowing [place] to be involved in your care.  It was great to see you today!  I hope you have a great winter,  stay warm!!

## 2020-06-08 LAB — HCV RNA, QUANT REAL-TIME PCR W/REFLEX
HCV RNA, PCR, QN (Log): <1.18 LogIU/mL
HCV RNA, PCR, QN: <15 IU/mL

## 2020-06-08 LAB — CBC WITH DIFFERENTIAL/PLATELET
Absolute Monocytes: 413 cells/uL (ref 200–950)
Basophils Absolute: 51 cells/uL (ref 0–200)
Basophils Relative: 1 %
Eosinophils Absolute: 112 cells/uL (ref 15–500)
Eosinophils Relative: 2.2 %
HCT: 23.5 % — ABNORMAL LOW (ref 38.5–50.0)
Hemoglobin: 7.1 g/dL — ABNORMAL LOW (ref 13.2–17.1)
Lymphs Abs: 2591 cells/uL (ref 850–3900)
MCH: 22 pg — ABNORMAL LOW (ref 27.0–33.0)
MCHC: 30.2 g/dL — ABNORMAL LOW (ref 32.0–36.0)
MCV: 72.8 fL — ABNORMAL LOW (ref 80.0–100.0)
MPV: 11 fL (ref 7.5–12.5)
Monocytes Relative: 8.1 %
Neutro Abs: 1933 cells/uL (ref 1500–7800)
Neutrophils Relative %: 37.9 %
Platelets: 327 10*3/uL (ref 140–400)
RBC: 3.23 10*6/uL — ABNORMAL LOW (ref 4.20–5.80)
RDW: 14.6 % (ref 11.0–15.0)
Total Lymphocyte: 50.8 %
WBC: 5.1 10*3/uL (ref 3.8–10.8)

## 2020-06-08 LAB — COMPREHENSIVE METABOLIC PANEL
AG Ratio: 1.2 (calc) (ref 1.0–2.5)
ALT: 12 U/L (ref 9–46)
AST: 20 U/L (ref 10–35)
Albumin: 4.3 g/dL (ref 3.6–5.1)
Alkaline phosphatase (APISO): 69 U/L (ref 35–144)
BUN: 16 mg/dL (ref 7–25)
CO2: 25 mmol/L (ref 20–32)
Calcium: 9.4 mg/dL (ref 8.6–10.3)
Chloride: 105 mmol/L (ref 98–110)
Creat: 1.21 mg/dL (ref 0.70–1.25)
Globulin: 3.6 g/dL (calc) (ref 1.9–3.7)
Glucose, Bld: 88 mg/dL (ref 65–99)
Potassium: 4.6 mmol/L (ref 3.5–5.3)
Sodium: 137 mmol/L (ref 135–146)
Total Bilirubin: 0.6 mg/dL (ref 0.2–1.2)
Total Protein: 7.9 g/dL (ref 6.1–8.1)

## 2020-09-07 ENCOUNTER — Encounter: Payer: Self-pay | Admitting: Gastroenterology

## 2020-12-01 ENCOUNTER — Encounter: Payer: Self-pay | Admitting: Gastroenterology

## 2020-12-01 ENCOUNTER — Other Ambulatory Visit: Payer: Self-pay

## 2020-12-01 ENCOUNTER — Ambulatory Visit: Payer: Medicare PPO | Admitting: Nurse Practitioner

## 2020-12-01 ENCOUNTER — Ambulatory Visit: Payer: Medicare PPO | Admitting: Gastroenterology

## 2020-12-01 VITALS — BP 144/79 | HR 94 | Temp 98.0°F | Ht 66.0 in | Wt 154.0 lb

## 2020-12-01 DIAGNOSIS — D509 Iron deficiency anemia, unspecified: Secondary | ICD-10-CM | POA: Diagnosis not present

## 2020-12-01 NOTE — Patient Instructions (Signed)
Please have blood work done today. We will call with the results!  I am getting the results of the colonoscopy and the endoscopy done recently at [facility].   Further recommendations to follow!   I enjoyed seeing you again today! As you know, I value our relationship and want to provide genuine, compassionate, and quality care. I welcome your feedback. If you receive a survey regarding your visit,  I greatly appreciate you taking time to fill this out. See you next time!  Gelene Mink, PhD, ANP-BC [facility]

## 2020-12-01 NOTE — Progress Notes (Signed)
Referring Provider: Lorelei Pont, DO Primary Care Physician:  Cory Pont, DO Primary GI: Dr. Jena Gauss    Chief Complaint  Patient presents with   Follow-up    F/U Hep C    HPI:   Cory Elliott is a 65 y.o. male presenting today with a history of History of Hep C genotype 1a, normal liver elastography, completing course of Epclusa in August 2021. Achieved SVR. Found to have acute drop in Hgb to 7.1 in Jan 2022, previously 11.6 in Aug 2021. We have not seen him since that time.   Notes he felt light-headed last week. States he went to his PCP (Dr. [name] in [place], [place]) last week. Didn't feel like getting out of the bed Friday. Had neck pain last week and was stiff with turning; he wondered if he slept wrong. Felt short of breath last week and fatigued. Feels better today.   [facility] in Jan 2022 and states he underwent EGD. Received 2 units PRBCs. States nothing was found. Believes he had a colonoscopy as well. States his stool was dark in Jan 2022. Now stool is brown. Good appetite, especially when he smokes weed. Sometimes doesn't feel like eating. Sometimes smoking cocaine. Takes a stool softener and vitamin. Not taking any NSAIDs.   Past Medical History:  Diagnosis Date   Arthritis    Chronic hepatitis C (HCC)    Hepatitis C antibody test positive    Polysubstance abuse (HCC)     Past Surgical History:  Procedure Laterality Date   NO PAST SURGERIES      Current Outpatient Medications  Medication Sig Dispense Refill   acetaminophen (TYLENOL) 500 MG tablet Take 500 mg by mouth as needed.     docusate sodium (COLACE) 100 MG capsule Take 100 mg by mouth daily.     tadalafil (CIALIS) 20 MG tablet Take 20 mg by mouth as needed for erectile dysfunction.      No current facility-administered medications for this visit.    Allergies as of 12/01/2020   (No Known Allergies)    Family History  Problem Relation Age of Onset   Colon cancer Neg  Hx     Social History   Socioeconomic History   Marital status: Single    Spouse name: Not on file   Number of children: Not on file   Years of education: Not on file   Highest education level: Not on file  Occupational History   Not on file  Tobacco Use   Smoking status: Never   Smokeless tobacco: Never  Substance and Sexual Activity   Alcohol use: Yes    Comment: 12/01/2020-1-2 beer/day   Drug use: Yes    Frequency: 2.0 times per week    Types: Marijuana, Cocaine    Comment: 12/01/2020-: marijuana twice a week; occasional cocaine (once every 2-3 weeks)   Sexual activity: Not on file  Other Topics Concern   Not on file  Social History Narrative   Not on file   Social Determinants of Health   Financial Resource Strain: Not on file  Food Insecurity: Not on file  Transportation Needs: Not on file  Physical Activity: Not on file  Stress: Not on file  Social Connections: Not on file    Review of Systems: Gen: see HPI CV: Denies chest pain, palpitations, syncope, peripheral edema, and claudication. Resp: Denies dyspnea at rest, cough, wheezing, coughing up blood, and pleurisy. GI: see HPI Derm: Denies rash,  itching, dry skin Psych: Denies depression, anxiety, memory loss, confusion. No homicidal or suicidal ideation.  Heme: Denies bruising, bleeding, and enlarged lymph nodes.  Physical Exam: BP (!) 144/79   Pulse 94   Temp 98 F (36.7 C) (Temporal)   Ht 5\' 6"  (1.676 m)   Wt 154 lb (69.9 kg)   BMI 24.86 kg/m  General:   Alert and oriented. No distress noted. Pleasant and cooperative. Occasional moderate stuttering.  Head:  Normocephalic and atraumatic. Eyes:  Conjuctiva clear without scleral icterus. Mouth:  mask in place Abdomen:  +BS, soft, non-tender and non-distended. No rebound or guarding. No HSM or masses noted. Msk:  Symmetrical without gross deformities. Normal posture. Extremities:  Without edema. Neurologic:  Alert and  oriented x4 Psych:  Alert and  cooperative. Normal mood and affect.    ASSESSMENT: [name] is a 65 y.o. male presenting today with history of Hep C genotype 1 a s/p treatment with documented SVR last year, new onset microcytic anemia documented in Jan 2022 and reportedly undergoing colonoscopy/EGD at [facility] and required blood transfusion (2 units PRBCs).   He presents today noting fatigue last week but denies any overt GI bleeding. No NSAIDs. Occasionally loss of appetite. From what I understand, he has not had any recent labs.  I am updating CBC and iron studies today and requesting procedures notes from [facility]. If he indeed had both procedures, will likely pursue capsule. Concern for recurrent anemia in light of his symptoms.    PLAN:  CBC, iron studies today Anticipate capsule study, +/- EGD with capsule depending on findings from [facility] in Jan 2022 Recommend illicit drug cessation Further recommendations to follow   Feb 2022, PhD, ANP-BC [facility]

## 2020-12-02 ENCOUNTER — Telehealth: Payer: Self-pay

## 2020-12-02 LAB — CBC WITH DIFFERENTIAL/PLATELET
Absolute Monocytes: 570 cells/uL (ref 200–950)
Basophils Absolute: 32 cells/uL (ref 0–200)
Basophils Relative: 0.7 %
Eosinophils Absolute: 129 cells/uL (ref 15–500)
Eosinophils Relative: 2.8 %
HCT: 15.4 % — ABNORMAL LOW (ref 38.5–50.0)
Hemoglobin: 4.2 g/dL — CL (ref 13.2–17.1)
Lymphs Abs: 2171 cells/uL (ref 850–3900)
MCH: 17.1 pg — ABNORMAL LOW (ref 27.0–33.0)
MCHC: 27.3 g/dL — ABNORMAL LOW (ref 32.0–36.0)
MCV: 62.9 fL — ABNORMAL LOW (ref 80.0–100.0)
MPV: 9.7 fL (ref 7.5–12.5)
Monocytes Relative: 12.4 %
Neutro Abs: 1697 cells/uL (ref 1500–7800)
Neutrophils Relative %: 36.9 %
Platelets: 256 10*3/uL (ref 140–400)
RBC: 2.45 10*6/uL — ABNORMAL LOW (ref 4.20–5.80)
RDW: 17.3 % — ABNORMAL HIGH (ref 11.0–15.0)
Total Lymphocyte: 47.2 %
WBC: 4.6 10*3/uL (ref 3.8–10.8)

## 2020-12-02 LAB — IRON,TIBC AND FERRITIN PANEL
%SAT: 1 % (calc) — ABNORMAL LOW (ref 20–48)
Ferritin: 2 ng/mL — ABNORMAL LOW (ref 24–380)
Iron: <10 ug/dL — ABNORMAL LOW (ref 50–180)
TIBC: 544 mcg/dL (calc) — ABNORMAL HIGH (ref 250–425)

## 2020-12-02 NOTE — Telephone Encounter (Signed)
Received labs that patient had done yesterday. Hgb is 4.2 g/dL, Hct 28.0, microcytic indices, ferritin 2. Iron less than 10.   I have contacted patient and spoke personally to him. He is fatigued but no overt GI bleeding. He will be going to the closest hospital, [facility]. He is aware of the life-threatening urgency of this.   I am still awaiting reports from prior evaluation. Most pressing issue is transfusion and likely [facility] will perform diagnostic procedures.

## 2020-12-02 NOTE — Telephone Encounter (Signed)
Lupita Leash @ [facility] critical on this pt. (Hemoglobin)

## 2020-12-29 ENCOUNTER — Telehealth: Payer: Self-pay | Admitting: Gastroenterology

## 2020-12-29 ENCOUNTER — Telehealth: Payer: Self-pay | Admitting: *Deleted

## 2020-12-29 ENCOUNTER — Other Ambulatory Visit: Payer: Self-pay | Admitting: *Deleted

## 2020-12-29 DIAGNOSIS — D509 Iron deficiency anemia, unspecified: Secondary | ICD-10-CM

## 2020-12-29 NOTE — Telephone Encounter (Signed)
  Received 1 unit PRBCs at [facility] on 12/03/20. Hgb improved to 8 after transfusion.   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 EGD with esophagitis in March 2022.   Courtney: can we have patient repeat CBC and iron studies now.  [facility] clinical pool: we need to arrange capsule study due to occult GI bleeding, transfusion dependent anemia.

## 2020-12-29 NOTE — Telephone Encounter (Signed)
PA for givens capsule study submitted via availity. Pending clinical review. Clinicals uploaded. Reference Number: [id number]

## 2020-12-29 NOTE — Telephone Encounter (Signed)
Spoke to pt. Informed him of results and recommendations. He stated he would have blood drawn when he makes appt. With the office he was referred to about his blood. Ask him the name of the office and doctor. He stated he did not know and he would call back with that information.

## 2020-12-29 NOTE — Telephone Encounter (Signed)
Spoke to pt informed me the name of the doctor is Dr. Phill Mutter he is a oncologist. He is trying to make a follow up appt. After receiving 4 units of blood. States he can not get in contact with anyone. Informed him that I would try and call the office.

## 2020-12-29 NOTE — Telephone Encounter (Signed)
Enter labs into [employer].

## 2020-12-30 ENCOUNTER — Encounter: Payer: Self-pay | Admitting: *Deleted

## 2020-12-30 NOTE — Telephone Encounter (Signed)
PA approved. Auth# [id number] DOS 01/04/2021-02/04/2021  Called pt. He has been scheduled for 8/18 at 7:30am. Discussed instructions in detail with him. He voiced understanding. Aware when he needs to stop his iron.

## 2021-01-07 ENCOUNTER — Ambulatory Visit (HOSPITAL_COMMUNITY)
Admission: RE | Admit: 2021-01-07 | Discharge: 2021-01-07 | Disposition: A | Discharge status: Home / Self Care | Payer: Medicare PPO | Attending: Internal Medicine | Admitting: Internal Medicine

## 2021-01-07 ENCOUNTER — Encounter (HOSPITAL_COMMUNITY)
Admission: RE | Disposition: A | Discharge status: Home / Self Care | Payer: Self-pay | Source: Home / Self Care | Attending: Internal Medicine

## 2021-01-07 DIAGNOSIS — D500 Iron deficiency anemia secondary to blood loss (chronic): Secondary | ICD-10-CM | POA: Diagnosis not present

## 2021-01-07 DIAGNOSIS — K633 Ulcer of intestine: Secondary | ICD-10-CM | POA: Insufficient documentation

## 2021-01-07 DIAGNOSIS — D509 Iron deficiency anemia, unspecified: Secondary | ICD-10-CM | POA: Insufficient documentation

## 2021-01-07 DIAGNOSIS — K259 Gastric ulcer, unspecified as acute or chronic, without hemorrhage or perforation: Secondary | ICD-10-CM | POA: Diagnosis not present

## 2021-01-07 DIAGNOSIS — K639 Disease of intestine, unspecified: Secondary | ICD-10-CM

## 2021-01-07 HISTORY — PX: GIVENS CAPSULE STUDY: SHX5432

## 2021-01-07 SURGERY — IMAGING PROCEDURE, GI TRACT, INTRALUMINAL, VIA CAPSULE

## 2021-01-08 ENCOUNTER — Encounter (HOSPITAL_COMMUNITY): Payer: Self-pay | Admitting: Internal Medicine

## 2021-01-15 ENCOUNTER — Telehealth: Payer: Self-pay | Admitting: Internal Medicine

## 2021-01-15 NOTE — Telephone Encounter (Signed)
Informed pt that Dr. Jena Gauss has been out of the office and that we will contact him with the results of his recent procedure as soon as he has a chance to review them. Pt verbalized understanding.

## 2021-01-15 NOTE — Telephone Encounter (Signed)
Patient called asking if his procedure results were in

## 2021-01-19 ENCOUNTER — Telehealth: Payer: Self-pay | Admitting: Gastroenterology

## 2021-01-19 DIAGNOSIS — D509 Iron deficiency anemia, unspecified: Secondary | ICD-10-CM

## 2021-01-19 DIAGNOSIS — D500 Iron deficiency anemia secondary to blood loss (chronic): Secondary | ICD-10-CM

## 2021-01-19 DIAGNOSIS — K639 Disease of intestine, unspecified: Secondary | ICD-10-CM

## 2021-01-19 NOTE — Telephone Encounter (Signed)
Please let pt know his capsule results as below.   Cory Elliott to make further recommendations.   Several tiny nonbleeding gastric and small bowel erosions as noted above. Two areas in the proximal small bowel with deeper disruption of the mucosal lining with erythema but no active bleeding. No obvious small bowel tumor. Rapid small bowel transit. Etiology of findings unclear, although in the setting of intermittent cocaine use, would consider ischemic process.  These findings may or may not be contributing to his overall profound iron deficiency anemia picture. Pertinent images reviewed with Dr. Jena Gauss.   Avoid NSAIDs.  Avoid illicit drug use. Monitor anemia. Consider further imaging of small bowel and mesenteric vasculature. Likely CTE initially. Will leave to discretion of the ordering provider, Cory Loron, NP.

## 2021-01-19 NOTE — Op Note (Addendum)
  Small Bowel Givens Capsule Study Procedure date:  01/07/21  Referring Provider:  Lewie Loron, NP PCP:  Dr. Lorelei Pont, DO  Indication for procedure: 65 year old male with new onset microcytic anemia documented in January 2022. Hemoglobin of 7.1 (down from 11.6 in August 2021). Completed EGD and colonoscopy March 2022, single left-sided diverticulum, esophagitis.  Labs last month with hemoglobin 14.2, ferritin 2.  Denies overt GI bleeding.  Patient data:  Wt: 69.9 kg Ht: 5 feet 6 inches  Findings: Rapid transit noted, capsule reached colon within 1 hour 3 minutes.  Several tiny nonbleeding gastric erosions noted for example at 1 minute and 22 seconds, 1 minute 49 seconds, 3 minutes 49 seconds, 4 minutes 46 seconds. At 6 minutes 18 seconds and 6 minutes 30 seconded two areas of disruption of the mucosa with eroded appearance. Throughout the entire small bowel there were several areas of hyperemia/erosions but no active bleeding. For example at 6 minutes 20 seconds, 6 minutes 37 seconds, 8 minutes 8 seconds, 8 minutes 45 seconds, 10 minutes 10 seconds, 10 minutes 13 seconds, 10 minutes 19 seconds, 1 hour 12 minutes.   First Gastric image: 34 seconds First Duodenal image: 6 minutes 7 seconds First Ileo-Cecal Valve image: 1 hour 4 minutes 33 seconds First Cecal image: 1 hour 4 minutes 37 seconds Gastric Passage time: 5 minutes  Small Bowel Passage time: 58 seconds   Summary & Recommendations: Several tiny nonbleeding gastric and small bowel erosions as noted above. Two areas in the proximal small bowel with deeper disruption of the mucosal lining with erythema but no active bleeding. No obvious small bowel tumor. Rapid small bowel transit. Etiology of findings unclear, although in the setting of intermittent cocaine use, would consider ischemic process.  These findings may or may not be contributing to his overall profound iron deficiency anemia picture. Pertinent images reviewed with Dr. Jena Gauss.    Avoid NSAIDs.  Monitor anemia. Avoid illicit drug use. Consider further imaging of small bowel and mesenteric vasculature. Likely CTE initially. Will leave to discretion of the ordering provider, Lewie Loron, NP.   Leanna Battles. [facility] [phone] 8/30/20225:34 PM  Attending note: Pertinent images reviewed.  Would favor a CTE with possibly getting better images of the mesenteric vasculature with 1 study.  Would proceed with cross-sectional imaging in consultation with radiology.

## 2021-01-20 ENCOUNTER — Encounter: Payer: Self-pay | Admitting: Urology

## 2021-01-20 ENCOUNTER — Ambulatory Visit (INDEPENDENT_AMBULATORY_CARE_PROVIDER_SITE_OTHER): Payer: Medicare PPO | Admitting: Urology

## 2021-01-20 ENCOUNTER — Other Ambulatory Visit: Payer: Self-pay

## 2021-01-20 VITALS — BP 154/94 | HR 85

## 2021-01-20 DIAGNOSIS — R351 Nocturia: Secondary | ICD-10-CM | POA: Diagnosis not present

## 2021-01-20 DIAGNOSIS — N401 Enlarged prostate with lower urinary tract symptoms: Secondary | ICD-10-CM

## 2021-01-20 DIAGNOSIS — R972 Elevated prostate specific antigen [PSA]: Secondary | ICD-10-CM

## 2021-01-20 DIAGNOSIS — N138 Other obstructive and reflux uropathy: Secondary | ICD-10-CM | POA: Insufficient documentation

## 2021-01-20 LAB — URINALYSIS, ROUTINE W REFLEX MICROSCOPIC
Bilirubin, UA: NEGATIVE
Glucose, UA: NEGATIVE
Ketones, UA: NEGATIVE
Leukocytes,UA: NEGATIVE
Nitrite, UA: NEGATIVE
Protein,UA: NEGATIVE
RBC, UA: NEGATIVE
Specific Gravity, UA: 1.015 (ref 1.005–1.030)
Urobilinogen, Ur: 0.2 mg/dL (ref 0.2–1.0)
pH, UA: 5.5 (ref 5.0–7.5)

## 2021-01-20 LAB — BLADDER SCAN AMB NON-IMAGING: Scan Result: 214

## 2021-01-20 MED ORDER — TADALAFIL 20 MG PO TABS: 20.0000 mg | ORAL_TABLET | Freq: Every day | ORAL | 5 refills | Status: DC | PRN

## 2021-01-20 MED ORDER — TADALAFIL 20 MG PO TABS: 20.0000 mg | ORAL_TABLET | ORAL | 6 refills | Status: AC | PRN

## 2021-01-20 MED ORDER — TAMSULOSIN HCL 0.4 MG PO CAPS: 0.4000 mg | ORAL_CAPSULE | Freq: Every day | ORAL | 11 refills | Status: DC

## 2021-01-20 NOTE — Patient Instructions (Signed)
Prostate-Specific Antigen Test Why am I having this test? The prostate-specific antigen (PSA) test is a screening test for prostate cancer. It can identify early signs of prostate cancer, which may allow for more effective treatment. Your health care provider may recommend that you have a PSA test starting at age 67 or that you have one earlier or later, depending on your risk factors for prostate cancer. You may also have a PSA test: To monitor treatment of prostate cancer. To check whether prostate cancer has returned after treatment. If you have signs of other conditions that can affect PSA levels, such as: An enlarged prostate that is not caused by cancer (benign prostatic hyperplasia, BPH). This condition is very common in older men. A prostate infection. What is being tested? This test measures the amount of PSA in your blood. PSA is a protein that is made in the prostate. The prostate naturally produces more PSA as you age, but very high levels may be a sign of a medical condition. What kind of sample is taken? A blood sample is required for this test. It is usually collected by inserting a needle into a blood vessel or by sticking a finger with a small needle. Blood for this test should be drawn before having an exam of the prostate. How do I prepare for this test? Do not ejaculate starting 24 hours before your test, or as long as told by your health care provider. Tell a health care provider about: Any allergies you have. All medicines you are taking, including vitamins, herbs, eye drops, creams, and over-the-counter medicines. This also includes: Medicines to assist with hair growth, such as finasteride. Any recent exposure to a medicine called diethylstilbestrol. Any blood disorders you have. Any recent procedures you have had, especially any procedures involving the prostate or rectum. Any medical conditions you have. Any recent urinary tract infections (UTIs) you have had. How are  the results reported? Your test results will be reported as a value that indicates how much PSA is in your blood. This will be given as nanograms of PSA per milliliter of blood (ng/mL). Your health care provider will compare your results to normal ranges that were established after testing a large group of people (reference ranges). Reference ranges may vary among labs and hospitals. PSA levels vary from person to person and generally increase with age. Because of this variation, there is no single PSA value that is considered normal for everyone. Instead, PSA reference ranges are used to describe whether your PSA levels are considered low or high (elevated). Common reference ranges are: Low: 0-2.5 ng/mL. Slightly to moderately elevated: 2.6-10.0 ng/mL. Moderately elevated: 10.0-19.9 ng/mL. Significantly elevated: 20 ng/mL or greater. Sometimes, the test results may report that a condition is present when it is not present (false-positive result). What do the results mean? A test result that is higher than 4 ng/mL may mean that you are at an increased risk for prostate cancer. However, a PSA test by itself is not enough to diagnose prostate cancer. High PSA levels may also be caused by the natural aging process, prostate infection, or BPH. PSA screening cannot tell you if your PSA is high due to cancer or a different cause. A prostate biopsy is the only way to diagnose prostate cancer. A risk of having the PSA test is diagnosing and treating prostate cancer that would never have caused any symptoms or problems (overdiagnosis and overtreatment). Talk with your health care provider about what your results mean. Questions   to ask your health care provider Ask your health care provider, or the department that is doing the test: When will my results be ready? How will I get my results? What are my treatment options? What other tests do I need? What are my next steps? Summary The prostate-specific  antigen (PSA) test is a screening test for prostate cancer. Your health care provider may recommend that you have a PSA test starting at age 67 or that you have one earlier or later, depending on your risk factors for prostate cancer. A test result that is higher than 4 ng/mL may mean that you are at an increased risk for prostate cancer. However, elevated levels can be caused by a number of conditions other than prostate cancer. Talk with your health care provider about what your results mean. This information is not intended to replace advice given to you by your health care provider. Make sure you discuss any questions you have with your health care provider. Document Revised: 01/23/2020 Document Reviewed: 01/23/2020 Elsevier Patient Education  2022 Elsevier Inc.  

## 2021-01-20 NOTE — Progress Notes (Signed)
post void residual=214  Urological Symptom Review  Patient is experiencing the following symptoms: Frequent urination Get up at night to urinate Stream starts and stops Trouble starting stream Sexually transmitted disease Erection problems (male only)   Review of Systems  Gastrointestinal (upper)  : Negative for upper GI symptoms  Gastrointestinal (lower) : Negative for lower GI symptoms  Constitutional : Weight loss  Skin: Negative for skin symptoms  Eyes: Negative for eye symptoms  Ear/Nose/Throat : Negative for Ear/Nose/Throat symptoms  Hematologic/Lymphatic: Negative for Hematologic/Lymphatic symptoms  Cardiovascular : Negative for cardiovascular symptoms  Respiratory : Negative for respiratory symptoms  Endocrine: Negative for endocrine symptoms  Musculoskeletal: Negative for musculoskeletal symptoms  Neurological: Negative for neurological symptoms  Psychologic: Negative for psychiatric symptoms

## 2021-01-20 NOTE — Progress Notes (Signed)
01/20/2021 11:51 AM   Cory Elliott 1955/10/22 [id number]  Referring provider: Lorelei Pont, DO [address] [place],  [place] [address]  Elevated PSA   HPI: Mr Depaula is a 65yo here for evaluation of elevated PSA. PSA 6.3 this year. No prior PSAs. IPSS 14 QOL 2. He has nocturia 2-3x. Urine stream fair. He has intermittent urinary hesitancy. No hematuria or dysuria. Brother has prostate cancer treated with radiation therapy. He complains today of longstanding erectile dysfunction. He can get a semifirm erection. He got an rx for cialis but it was too expensive and he never filled the prescription. NO other complaints today.    PMH: Past Medical History:  Diagnosis Date   Arthritis    Chronic hepatitis C (HCC)    Hepatitis C antibody test positive    Polysubstance abuse (HCC)     Surgical History: Past Surgical History:  Procedure Laterality Date   GIVENS CAPSULE STUDY N/A 01/07/2021   Procedure: GIVENS CAPSULE STUDY;  Surgeon: Corbin Ade, MD;  Location: [facility];  Service: Endoscopy;  Laterality: N/A;  7:30am   NO PAST SURGERIES      Home Medications:  Allergies as of 01/20/2021   No Known Allergies      Medication List        Accurate as of January 20, 2021 11:51 AM. If you have any questions, ask your nurse or doctor.          acetaminophen 500 MG tablet Commonly known as: TYLENOL Take 500 mg by mouth as needed.   docusate sodium 100 MG capsule Commonly known as: COLACE Take 100 mg by mouth daily.   ferrous sulfate 325 (65 FE) MG tablet FeroSul 325 mg (65 mg iron) tablet  TAKE 1 TABLET BY MOUTH DAILY FOR ANEMIA   pantoprazole 40 MG tablet Commonly known as: PROTONIX Take 40 mg by mouth every morning.   tadalafil 20 MG tablet Commonly known as: CIALIS Take 20 mg by mouth as needed for erectile dysfunction.        Allergies: No Known Allergies  Family History: Family History  Problem Relation Age of Onset   Colon cancer Neg Hx      Social History:  reports that he has never smoked. He has never used smokeless tobacco. He reports current alcohol use. He reports current drug use. Frequency: 2.00 times per week. Drugs: Marijuana and Cocaine.  ROS: All other review of systems were reviewed and are negative except what is noted above in HPI  Physical Exam: BP (!) 154/94   Pulse 85   Constitutional:  Alert and oriented, No acute distress. HEENT:  AT, moist mucus membranes.  Trachea midline, no masses. Cardiovascular: No clubbing, cyanosis, or edema. Respiratory: Normal respiratory effort, no increased work of breathing. GI: Abdomen is soft, nontender, nondistended, no abdominal masses GU: No CVA tenderness. Circumcised phallus. No masses/lesions on penis, testis, scrotum. Prostate 40g smooth no nodules no induration.  Lymph: No cervical or inguinal lymphadenopathy. Skin: No rashes, bruises or suspicious lesions. Neurologic: Grossly intact, no focal deficits, moving all 4 extremities. Psychiatric: Normal mood and affect.  Laboratory Data: Lab Results  Component Value Date   WBC 4.6 12/01/2020   HGB 4.2 (LL) 12/01/2020   HCT 15.4 (L) 12/01/2020   MCV 62.9 (L) 12/01/2020   PLT 256 12/01/2020    Lab Results  Component Value Date   CREATININE 1.21 06/03/2020    No results found for: PSA  No results found for: TESTOSTERONE  No results found for: HGBA1C  Urinalysis No results found for: COLORURINE, APPEARANCEUR, LABSPEC, PHURINE, GLUCOSEU, HGBUR, BILIRUBINUR, KETONESUR, PROTEINUR, UROBILINOGEN, NITRITE, LEUKOCYTESUR  No results found for: LABMICR, WBCUA, RBCUA, LABEPIT, MUCUS, BACTERIA  Pertinent Imaging:  No results found for this or any previous visit.  No results found for this or any previous visit.  No results found for this or any previous visit.  No results found for this or any previous visit.  No results found for this or any previous visit.  No results found for this or any previous  visit.  No results found for this or any previous visit.  No results found for this or any previous visit.   Assessment & Plan:    1. Elevated PSA -The patient and I talked about etiologies of elevated PSA.  We discussed the possible relationship between elevated PSA and prostate cancer, BPH, prostatitis, infection trauma and recent ejaculations.  I recommended that we follow-up with a repeat PSA in 3 months.  If it remains elevated with a positive rising trend we will discuss prostate biopsy at his follow-up appointment.  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 Nocturia -flomax 0.4mg  daily   No follow-ups on file.  Wilkie Aye, MD  [facility] [place]

## 2021-01-22 NOTE — Telephone Encounter (Signed)
Pt was made aware of results and verbalized understanding. Informed pt that Cory Elliott may have further recommendations and that we would contact him if she does.

## 2021-01-26 ENCOUNTER — Encounter: Payer: Self-pay | Admitting: Internal Medicine

## 2021-01-26 NOTE — Telephone Encounter (Signed)
As not having pain, I recommend CTE to evaluate small bowel further.   [facility] clinical pool: please arrange CTE due to IDA, transfusion dependent anemia.  Stacey:  please arrange follow-up for patient.

## 2021-01-27 NOTE — Telephone Encounter (Signed)
PA pending review via human[employer] for CTE. Tracking#: [id number]

## 2021-01-27 NOTE — Addendum Note (Signed)
Addended by: Armstead Peaks on: 01/27/2021 09:51 AM   Modules accepted: Orders

## 2021-02-02 NOTE — Telephone Encounter (Signed)
PA approved via human[employer]. Auth# [id number], DOS 01/28/2021-02/27/2021  CTE scheduled for 10/6 at 12:00pm, arrival 10:30am, npo 4 hrs prior  Called pt and is aware of appt details. He voiced understanding

## 2021-02-02 NOTE — Telephone Encounter (Signed)
PA still pending review.

## 2021-02-02 NOTE — Telephone Encounter (Signed)
Dr. Selena Batten called from [facility]. Answered clinical questions he had. Advised again of previous recommendations from capsule study

## 2021-02-25 ENCOUNTER — Ambulatory Visit (HOSPITAL_COMMUNITY): Payer: Medicare PPO

## 2021-03-17 ENCOUNTER — Encounter: Payer: Self-pay | Admitting: Urology

## 2021-03-17 ENCOUNTER — Other Ambulatory Visit: Payer: Self-pay

## 2021-03-17 ENCOUNTER — Ambulatory Visit (INDEPENDENT_AMBULATORY_CARE_PROVIDER_SITE_OTHER): Payer: Medicare PPO | Admitting: Urology

## 2021-03-17 VITALS — BP 160/89 | HR 63

## 2021-03-17 DIAGNOSIS — N401 Enlarged prostate with lower urinary tract symptoms: Secondary | ICD-10-CM

## 2021-03-17 DIAGNOSIS — N138 Other obstructive and reflux uropathy: Secondary | ICD-10-CM | POA: Diagnosis not present

## 2021-03-17 DIAGNOSIS — R351 Nocturia: Secondary | ICD-10-CM

## 2021-03-17 DIAGNOSIS — R972 Elevated prostate specific antigen [PSA]: Secondary | ICD-10-CM | POA: Diagnosis not present

## 2021-03-17 LAB — URINALYSIS, ROUTINE W REFLEX MICROSCOPIC
Bilirubin, UA: NEGATIVE
Glucose, UA: NEGATIVE
Ketones, UA: NEGATIVE
Leukocytes,UA: NEGATIVE
Nitrite, UA: NEGATIVE
Protein,UA: NEGATIVE
RBC, UA: NEGATIVE
Specific Gravity, UA: <1.005 — ABNORMAL LOW (ref 1.005–1.030)
Urobilinogen, Ur: 0.2 mg/dL (ref 0.2–1.0)
pH, UA: 5.5 (ref 5.0–7.5)

## 2021-03-17 LAB — BLADDER SCAN AMB NON-IMAGING: Scan Result: 164

## 2021-03-17 MED ORDER — TAMSULOSIN HCL 0.4 MG PO CAPS: 0.4000 mg | ORAL_CAPSULE | Freq: Every day | ORAL | 11 refills | Status: DC

## 2021-03-17 NOTE — Progress Notes (Signed)
post void residual=164  Urological Symptom Review  Patient is experiencing the following symptoms: Frequent urination Get up at night to urinate   Review of Systems  Gastrointestinal (upper)  : Negative for upper GI symptoms  Gastrointestinal (lower) : Negative for lower GI symptoms  Constitutional : Negative for symptoms  Skin: Negative for skin symptoms  Eyes: Negative for eye symptoms  Ear/Nose/Throat : Negative for Ear/Nose/Throat symptoms  Hematologic/Lymphatic: Negative for Hematologic/Lymphatic symptoms  Cardiovascular : Negative for cardiovascular symptoms  Respiratory : Negative for respiratory symptoms  Endocrine: Negative for endocrine symptoms  Musculoskeletal: Joint pain  Neurological: Negative for neurological symptoms  Psychologic: Negative for psychiatric symptoms

## 2021-03-17 NOTE — Progress Notes (Signed)
03/17/2021 11:01 AM   Cory Elliott [birth year] [id number]  Referring provider: Lorelei Pont, DO [address] [place],  [place] [address]  Followup BPH and nocturia   HPI: Cory Elliott is 58YT here for followup for BPH with nocturia. Last visit we started flomax 0.4mg  daily and he notes significant improvement in his LUTS. IPSS 6 QOL 1 on flomax 0.4mg  daily. Nocturia 1-2x. Urine stream strong. No straining to urinate. No other complaints today   PMH: Past Medical History:  Diagnosis Date   Arthritis    Chronic hepatitis C (HCC)    Hepatitis C antibody test positive    Polysubstance abuse (HCC)     Surgical History: Past Surgical History:  Procedure Laterality Date   GIVENS CAPSULE STUDY N/A 01/07/2021   Procedure: GIVENS CAPSULE STUDY;  Surgeon: Corbin Ade, MD;  [facility];  Service: Endoscopy;  Laterality: N/A;  7:30am   NO PAST SURGERIES      Home Medications:  Allergies as of 03/17/2021   No Known Allergies      Medication List        Accurate as of March 17, 2021 11:01 AM. If you have any questions, ask your nurse or doctor.          acetaminophen 500 MG tablet Commonly known as: TYLENOL Take 500 mg by mouth as needed.   docusate sodium 100 MG capsule Commonly known as: COLACE Take 100 mg by mouth daily.   ferrous sulfate 325 (65 FE) MG tablet FeroSul 325 mg (65 mg iron) tablet  TAKE 1 TABLET BY MOUTH DAILY FOR ANEMIA   pantoprazole 40 MG tablet Commonly known as: PROTONIX Take 40 mg by mouth every morning.   tadalafil 20 MG tablet Commonly known as: CIALIS Take 1 tablet (20 mg total) by mouth daily as needed.   tadalafil 20 MG tablet Commonly known as: CIALIS Take 1 tablet (20 mg total) by mouth as needed for erectile dysfunction.   tamsulosin 0.4 MG Caps capsule Commonly known as: FLOMAX Take 1 capsule (0.4 mg total) by mouth daily after supper.        Allergies: No Known Allergies  Family History: Family  History  Problem Relation Age of Onset   Colon cancer Neg Hx     Social History:  reports that he has never smoked. He has never used smokeless tobacco. He reports current alcohol use. He reports current drug use. Frequency: 2.00 times per week. Drugs: Marijuana and Cocaine.  ROS: All other review of systems were reviewed and are negative except what is noted above in HPI  Physical Exam: BP (!) 160/89   Pulse 63   Constitutional:  Alert and oriented, No acute distress. HEENT: [place] AT, moist mucus membranes.  Trachea midline, no masses. Cardiovascular: No clubbing, cyanosis, or edema. Respiratory: Normal respiratory effort, no increased work of breathing. GI: Abdomen is soft, nontender, nondistended, no abdominal masses GU: No CVA tenderness.  Lymph: No cervical or inguinal lymphadenopathy. Skin: No rashes, bruises or suspicious lesions. Neurologic: Grossly intact, no focal deficits, moving all 4 extremities. Psychiatric: Normal mood and affect.  Laboratory Data: Lab Results  Component Value Date   WBC 4.6 12/01/2020   HGB 4.2 (LL) 12/01/2020   HCT 15.4 (L) 12/01/2020   MCV 62.9 (L) 12/01/2020   PLT 256 12/01/2020    Lab Results  Component Value Date   CREATININE 1.21 06/03/2020    No results found for: PSA  No results found for: TESTOSTERONE  No  results found for: HGBA1C  Urinalysis    Component Value Date/Time   APPEARANCEUR Clear 01/20/2021 1309   GLUCOSEU Negative 01/20/2021 1309   BILIRUBINUR Negative 01/20/2021 1309   PROTEINUR Negative 01/20/2021 1309   NITRITE Negative 01/20/2021 1309   LEUKOCYTESUR Negative 01/20/2021 1309    Lab Results  Component Value Date   LABMICR Comment 01/20/2021    Pertinent Imaging:  No results found for this or any previous visit.  No results found for this or any previous visit.  No results found for this or any previous visit.  No results found for this or any previous visit.  No results found for this or any  previous visit.  No results found for this or any previous visit.  No results found for this or any previous visit.  No results found for this or any previous visit.   Assessment & Plan:    1. Benign prostatic hyperplasia with urinary obstruction -continue flomax 0.4mg  daily - Urinalysis, Routine w reflex microscopic - BLADDER SCAN AMB NON-IMAGING  2. Nocturia -continue flomax 0.4mg  daily  3. Elevated PSA RTC 3 months with PSA  No follow-ups on file.  Wilkie Aye, MD  [facility]

## 2021-03-17 NOTE — Patient Instructions (Signed)
Benign Prostatic Hyperplasia Benign prostatic hyperplasia (BPH) is an enlarged prostate gland that is caused by the normal aging process and not by cancer. The prostate is a walnut-sized gland that is involved in the production of semen. It is located in front of the rectum and below the bladder. The bladder stores urine and the urethra is the tube that carries the urine out of the body. The prostate may get bigger as a man gets older. An enlarged prostate can press on the urethra. This can make it harder to pass urine. The build-up of urine in the bladder can cause infection. Back pressure and infection may progress to bladder damage and kidney (renal) failure. What are the causes? This condition is part of a normal aging process. However, not all men develop problems from this condition. If the prostate enlarges away from the urethra, urine flow will not be blocked. If it enlarges toward the urethra and compresses it, there will be problems passing urine. What increases the risk? This condition is more likely to develop in men over the age of 50 years. What are the signs or symptoms? Symptoms of this condition include: Getting up often during the night to urinate. Needing to urinate frequently during the day. Difficulty starting urine flow. Decrease in size and strength of your urine stream. Leaking (dribbling) after urinating. Inability to pass urine. This needs immediate treatment. Inability to completely empty your bladder. Pain when you pass urine. This is more common if there is also an infection. Urinary tract infection (UTI). How is this diagnosed? This condition is diagnosed based on your medical history, a physical exam, and your symptoms. Tests will also be done, such as: A post-void bladder scan. This measures any amount of urine that may remain in your bladder after you finish urinating. A digital rectal exam. In a rectal exam, your health care provider checks your prostate by  putting a lubricated, gloved finger into your rectum to feel the back of your prostate gland. This exam detects the size of your gland and any abnormal lumps or growths. An exam of your urine (urinalysis). A prostate specific antigen (PSA) screening. This is a blood test used to screen for prostate cancer. An ultrasound. This test uses sound waves to electronically produce a picture of your prostate gland. Your health care provider may refer you to a specialist in kidney and prostate diseases (urologist). How is this treated? Once symptoms begin, your health care provider will monitor your condition (active surveillance or watchful waiting). Treatment for this condition will depend on the severity of your condition. Treatment may include: Observation and yearly exams. This may be the only treatment needed if your condition and symptoms are mild. Medicines to relieve your symptoms, including: Medicines to shrink the prostate. Medicines to relax the muscle of the prostate. Surgery in severe cases. Surgery may include: Prostatectomy. In this procedure, the prostate tissue is removed completely through an open incision or with a laparoscope or robotics. Transurethral resection of the prostate (TURP). In this procedure, a tool is inserted through the opening at the tip of the penis (urethra). It is used to cut away tissue of the inner core of the prostate. The pieces are removed through the same opening of the penis. This removes the blockage. Transurethral incision (TUIP). In this procedure, small cuts are made in the prostate. This lessens the prostate's pressure on the urethra. Transurethral microwave thermotherapy (TUMT). This procedure uses microwaves to create heat. The heat destroys and removes a   small amount of prostate tissue. Transurethral needle ablation (TUNA). This procedure uses radio frequencies to destroy and remove a small amount of prostate tissue. Interstitial laser coagulation (ILC).  This procedure uses a laser to destroy and remove a small amount of prostate tissue. Transurethral electrovaporization (TUVP). This procedure uses electrodes to destroy and remove a small amount of prostate tissue. Prostatic urethral lift. This procedure inserts an implant to push the lobes of the prostate away from the urethra. Follow these instructions at home: Take over-the-counter and prescription medicines only as told by your health care provider. Monitor your symptoms for any changes. Contact your health care provider with any changes. Avoid drinking large amounts of liquid before going to bed or out in public. Avoid or reduce how much caffeine or alcohol you drink. Give yourself time when you urinate. Keep all follow-up visits as told by your health care provider. This is important. Contact a health care provider if: You have unexplained back pain. Your symptoms do not get better with treatment. You develop side effects from the medicine you are taking. Your urine becomes very dark or has a bad smell. Your lower abdomen becomes distended and you have trouble passing your urine. Get help right away if: You have a fever or chills. You suddenly cannot urinate. You feel lightheaded, or very dizzy, or you faint. There are large amounts of blood or clots in the urine. Your urinary problems become hard to manage. You develop moderate to severe low back or flank pain. The flank is the side of your body between the ribs and the hip. These symptoms may represent a serious problem that is an emergency. Do not wait to see if the symptoms will go away. Get medical help right away. Call your local emergency services (911 in the [place] Do not drive yourself to the hospital. Summary Benign prostatic hyperplasia (BPH) is an enlarged prostate that is caused by the normal aging process and not by cancer. An enlarged prostate can press on the urethra. This can make it hard to pass urine. This  condition is part of a normal aging process and is more likely to develop in men over the age of 50 years. Get help right away if you suddenly cannot urinate. This information is not intended to replace advice given to you by your health care provider. Make sure you discuss any questions you have with your health care provider. Document Revised: 08/19/2020 Document Reviewed: 01/16/2020 Elsevier Patient Education  2022 Elsevier Inc.  

## 2021-06-09 ENCOUNTER — Ambulatory Visit: Payer: Medicare PPO | Admitting: Gastroenterology

## 2021-06-09 ENCOUNTER — Other Ambulatory Visit: Payer: Medicare PPO

## 2021-06-16 ENCOUNTER — Ambulatory Visit: Payer: Medicare PPO | Admitting: Urology

## 2021-06-16 DIAGNOSIS — R972 Elevated prostate specific antigen [PSA]: Secondary | ICD-10-CM

## 2021-06-16 DIAGNOSIS — R351 Nocturia: Secondary | ICD-10-CM

## 2021-06-21 IMAGING — US US ABDOMEN COMPLETE W/ ELASTOGRAPHY
1 series · 12 of 15 positions shown · non-contrast
Comparison: None.

CLINICAL DATA: Hepatitis C virus.

EXAM:
ULTRASOUND ABDOMEN
ULTRASOUND HEPATIC ELASTOGRAPHY
TECHNIQUE: Sonography of the upper abdomen was performed. In addition,
ultrasound elastography evaluation of the liver was performed. A
region of interest was placed within the right lobe of the liver.
Following application of a compressive sonographic pulse, tissue
compressibility was assessed. Multiple assessments were performed at
the selected site. Median tissue compressibility was determined.
Previously, hepatic stiffness was assessed by shear wave velocity.
Based on recently published Society of Radiologists in Ultrasound
consensus article, reporting is now recommended to be performed in
the SI units of pressure (kiloPascals) representing hepatic
stiffness/elasticity. The obtained result is compared to the
published reference standards. (cACLD = compensated Advanced Chronic
Liver Disease)

[Series 1: abdomen us · 12 of 15 slices shown]
[im 1/15]
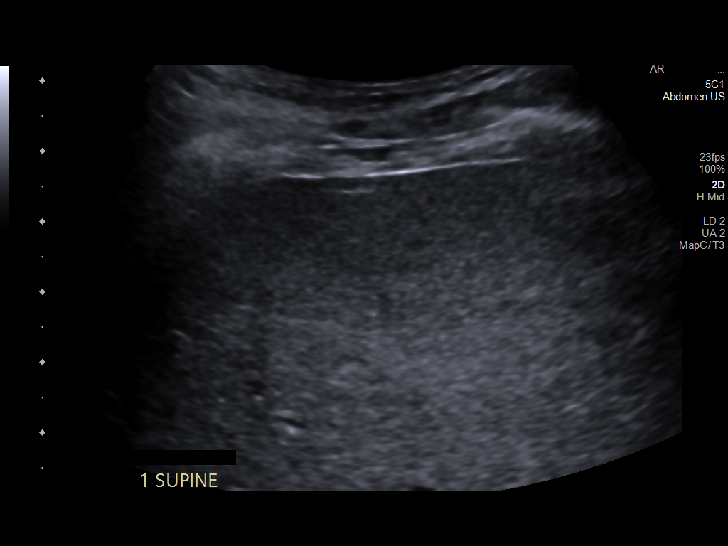
[im 2/15]
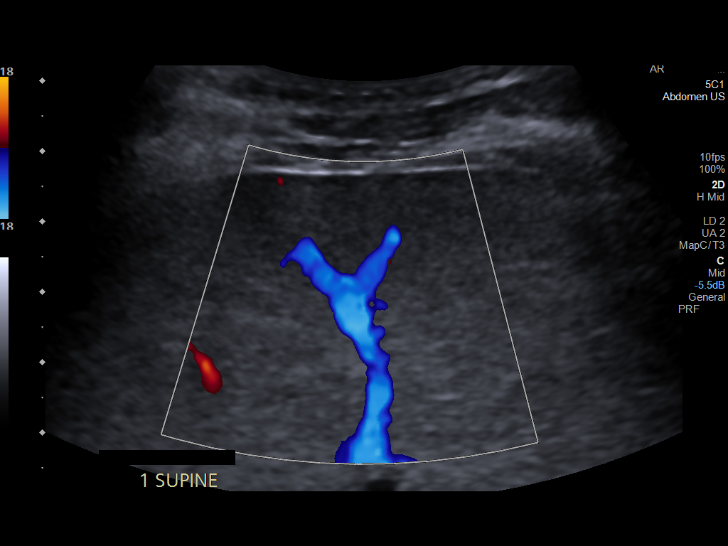
[im 4/15]
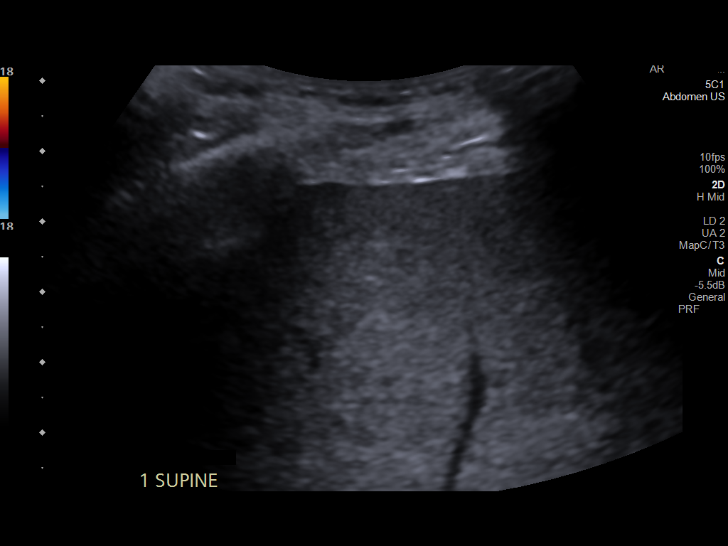
[im 5/15]
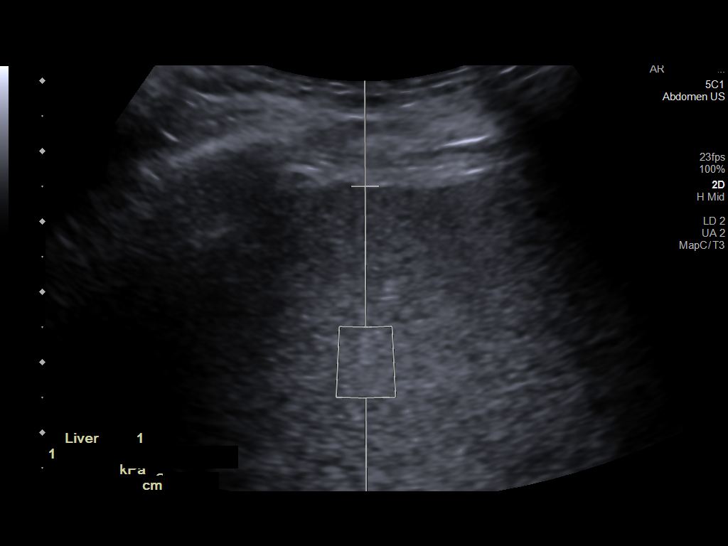
[im 6/15]
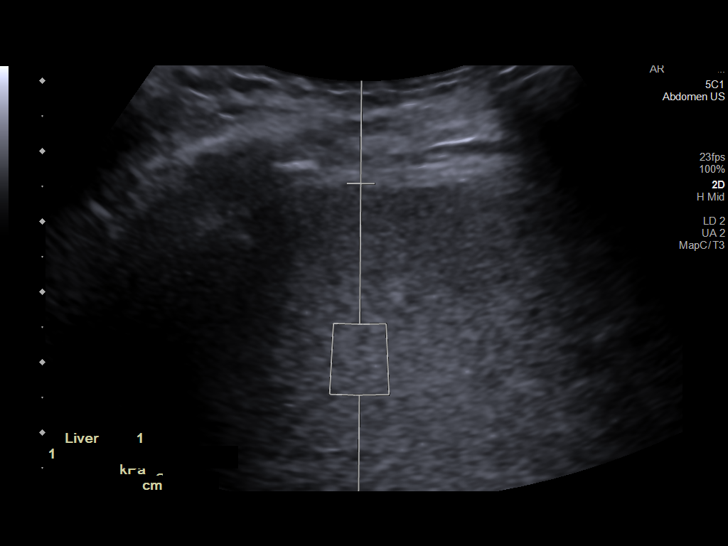
[im 7/15]
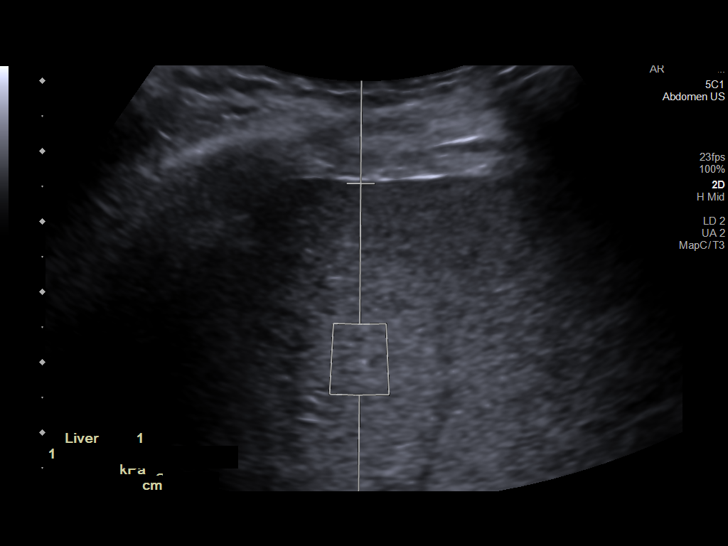
[im 9/15]
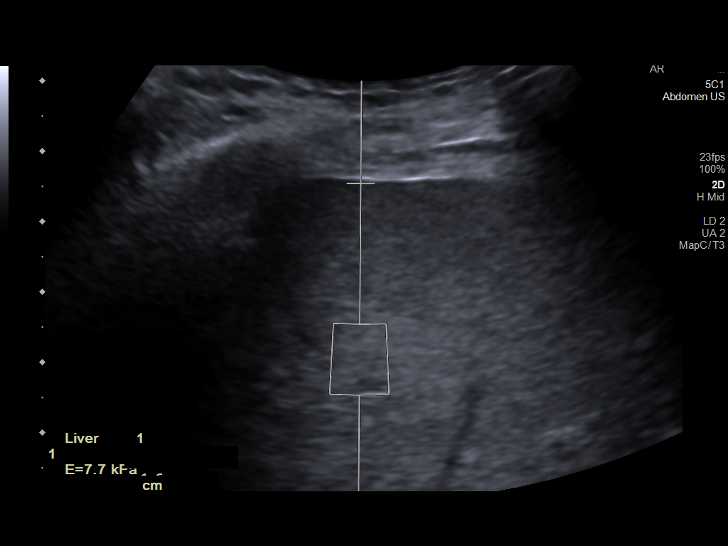
[im 10/15]
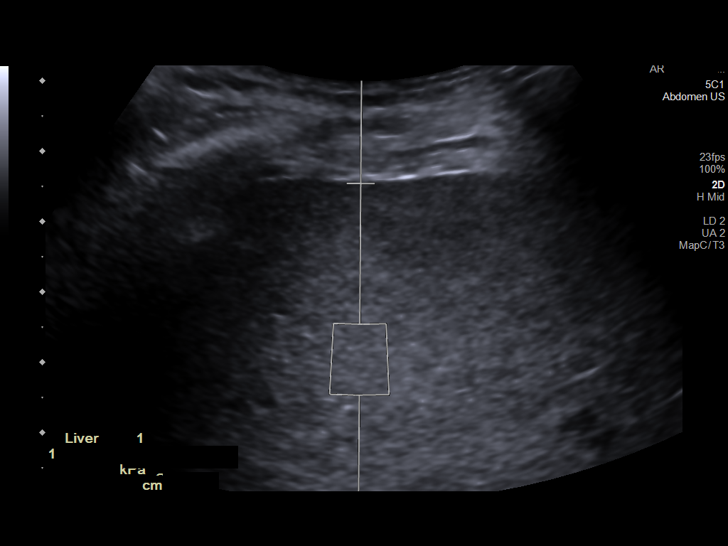
[im 11/15]
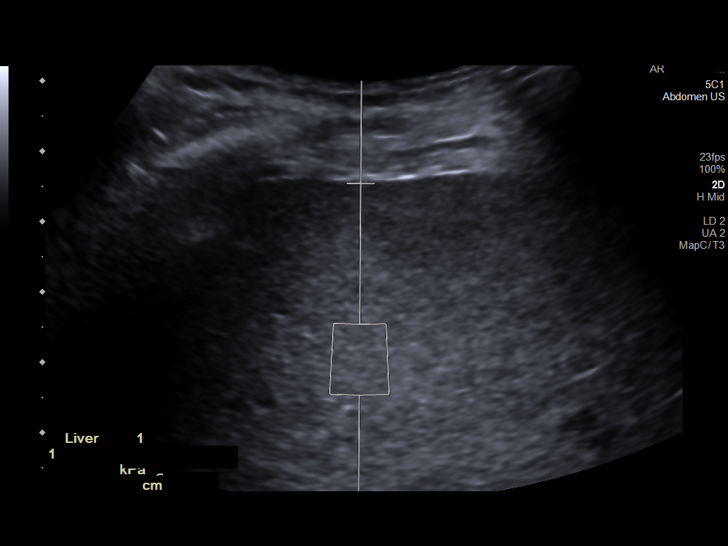
[im 12/15]
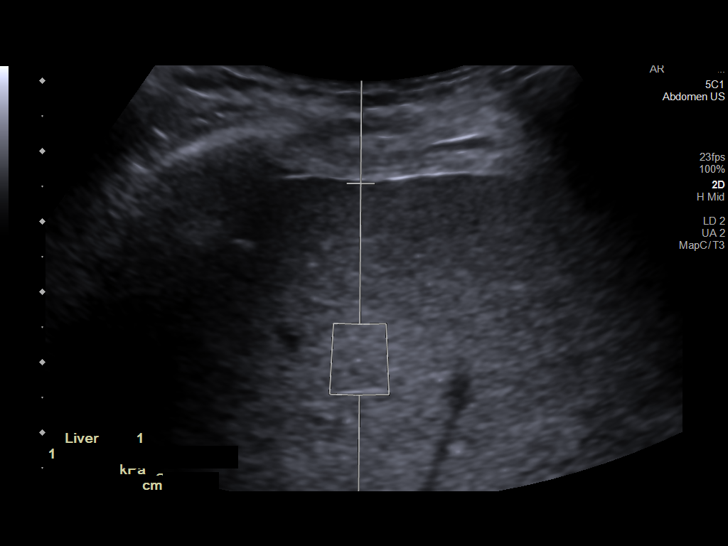
[im 14/15]
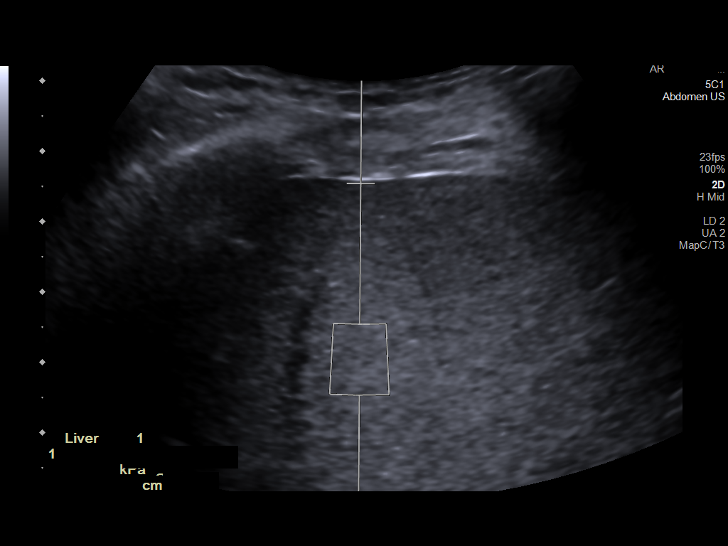
[im 15/15]
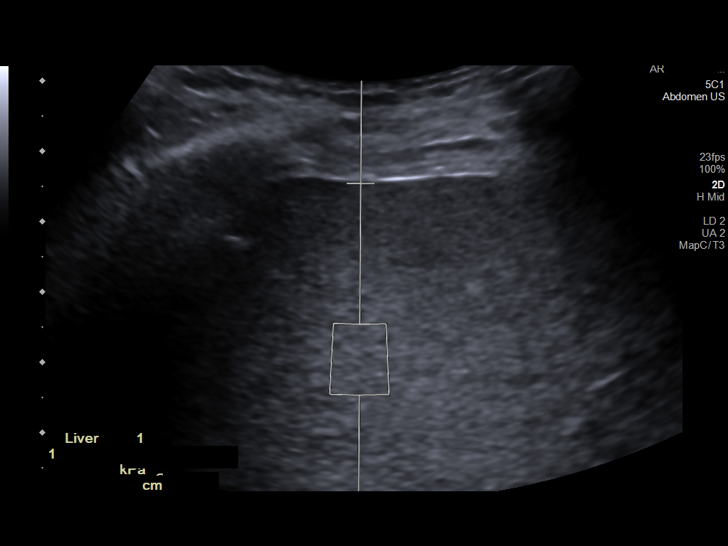

[12 of 15 positions shown; findings below may reference images not displayed]

FINDINGS: ULTRASOUND ABDOMEN

Gallbladder: No gallstones or wall thickening visualized. No
sonographic Murphy sign noted by sonographer.

Common bile duct: Diameter: Normal at 4 mm

Liver: Mild liver nodularity. Normal echogenicity. Portal vein is
patent on color Doppler imaging with normal direction of blood flow
towards the liver.

IVC: No abnormality visualized.

Pancreas: Visualized portion unremarkable.

Spleen: Size and appearance within normal limits.

Right Kidney: Length: 12.5. Echogenicity within normal limits. No
mass or hydronephrosis visualized.

Left Kidney: Length: 10.3. Echogenicity within normal limits. No
mass or hydronephrosis visualized.

Abdominal aorta: No aneurysm visualized.

Other findings: No ascites

ULTRASOUND HEPATIC ELASTOGRAPHY

Device: Siemens Helix VTQ

Patient position: Supine

Transducer 5C1

Number of measurements: 10

Hepatic segment:  8

Median kPa:

IQR:

IQR/Median kPa ratio:

Data quality:  Good

Diagnostic category:  < or = 5 kPa: high probability of being normal
IMPRESSION: ULTRASOUND ABDOMEN:

1. Lobular contour of the liver.
2. Normal gallbladder and biliary tree.

ULTRASOUND HEPATIC ELASTOGRAPHY:

Median kPa:

Diagnostic category:  < or = 5 kPa: high probability of being normal

The use of hepatic elastography is applicable to patients with viral
hepatitis and non-alcoholic fatty liver disease. At this time, there
is insufficient data for the referenced cut-off values and use in
other causes of liver disease, including alcoholic liver disease.
Patients, however, may be assessed by elastography and serve as
their own reference standard/baseline.

In patients with non-alcoholic liver disease, the values suggesting
compensated advanced chronic liver disease (cACLD) may be lower, and
patients may need additional testing with elasticity results of [DATE]
kPa.

Please note that abnormal hepatic elasticity and shear wave
velocities may also be identified in clinical settings other than
with hepatic fibrosis, such as: acute hepatitis, elevated right
heart and central venous pressures including use of beta blockers,
Geijo disease ([place]), infiltrative processes such as
mastocytosis/amyloidosis/infiltrative tumor/lymphoma, extrahepatic
cholestasis, with hyperemia in the post-prandial state, and with
liver transplantation. Correlation with patient history, laboratory
data, and clinical condition recommended.

Diagnostic Categories:

< or =5 kPa: high probability of being normal

< or =9 kPa: in the absence of other known clinical signs, rules [DATE] kPa and ?13 kPa: suggestive of cACLD, but needs further testing

>13 kPa: highly suggestive of cACLD

> or =17 kPa: highly suggestive of cACLD with an increased
probability of clinically significant portal hypertension

## 2021-10-05 ENCOUNTER — Encounter: Payer: Self-pay | Admitting: Internal Medicine

## 2021-10-05 ENCOUNTER — Ambulatory Visit: Payer: Medicare PPO | Admitting: Gastroenterology

## 2021-11-02 ENCOUNTER — Ambulatory Visit (INDEPENDENT_AMBULATORY_CARE_PROVIDER_SITE_OTHER): Payer: Medicare PPO | Admitting: Internal Medicine

## 2021-11-02 ENCOUNTER — Encounter: Payer: Self-pay | Admitting: Internal Medicine

## 2021-11-02 VITALS — BP 118/72 | HR 78 | Temp 97.7°F | Ht 66.0 in | Wt 149.8 lb

## 2021-11-02 DIAGNOSIS — D509 Iron deficiency anemia, unspecified: Secondary | ICD-10-CM

## 2021-11-02 NOTE — Progress Notes (Signed)
Primary Care Physician:  Lorelei Pont, DO Primary Gastroenterologist:  Dr. Jena Gauss  Pre-Procedure History & Physical: HPI:  Cory Elliott is a 65 y.o. male here for follow-up of profound iron deficiency anemia presumably secondary to gastric and duodenal erosions/ulcerations in the setting of cocaine and NSAID use.  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 Patient states he is not taking any NSAIDs although doctor did prescribe a "pain" medication for him which she has at home but did not bring it with him.  Has not had any melena although stools are chronically dark on iron.  Energy level is good these days.  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  He likes home brew moonshine when eating get it.  Continues to smoke marijuana on a regular basis but is trying to cut back on smoking cocaine.  HCV infection eradicated as chronicled in the prior notes.  We did pursue a CTE last year but patient failed to follow through on the evaluation.  He denies any abdominal pain, whatsoever.  He has not taking any acid suppression therapy.  He denies nausea vomiting hematochezia melena, GERD symptoms or dysphagia.  Appetite described as being good.  Past Medical History:  Diagnosis Date   Arthritis    Chronic hepatitis C (HCC)    Hepatitis C antibody test positive    Polysubstance abuse [facility])     Past Surgical History:  Procedure Laterality Date   GIVENS CAPSULE STUDY N/A 01/07/2021   Procedure: GIVENS CAPSULE STUDY;  Surgeon: Corbin Ade, MD;  [facility];  Service: Endoscopy;  Laterality: N/A;  7:30am   NO PAST SURGERIES      Prior to Admission medications   Medication Sig Start Date End Date Taking? Authorizing Provider  docusate sodium (COLACE) 100 MG capsule Take 100 mg by mouth daily.   Yes [provider]  ferrous sulfate 325 (65 FE) MG tablet FeroSul 325 mg (65 mg iron) tablet  TAKE 1 TABLET BY MOUTH DAILY FOR ANEMIA   Yes [provider]  tadalafil (CIALIS) 20 MG tablet Take 1 tablet (20 mg total) by mouth as needed for erectile dysfunction. 01/20/21  Yes McKenzie, Mardene Celeste, MD  tamsulosin (FLOMAX) 0.4 MG CAPS capsule Take 1 capsule (0.4 mg total) by mouth daily after supper. 03/17/21  Yes McKenzie, Mardene Celeste, MD    Allergies as of 11/02/2021   (No Known Allergies)    Family History  Problem Relation Age of Onset   Colon cancer Neg Hx     Social History   Socioeconomic History   Marital status: Single    Spouse name: Not on file   Number of children: Not on file   Years of education: Not on file   Highest education level: Not on file  Occupational History   Not on file  Tobacco Use   Smoking status: Never   Smokeless tobacco: Never  Substance and Sexual Activity   Alcohol use: Yes    Alcohol/week: 4.0 standard drinks of alcohol    Types: 4 Cans of beer per week   Drug use: Yes    Frequency: 2.0 times per week    Types: Marijuana, Cocaine    Comment: 11/02/21 marijuana twice a week; occasional cocaine (once every 2-3 weeks)   Sexual activity: Yes  Other Topics Concern  Not on file  Social History Narrative   Not on file   Social Determinants of Health   Financial Resource Strain: Not on file  Food Insecurity: Not on file  Transportation Needs: Not on file  Physical Activity: Not on file  Stress: Not on file  Social Connections: Not on file  Intimate Partner Violence: Not on file    Review of Systems: See HPI, otherwise negative ROS  Physical Exam: BP 118/72 (BP Location: Right Arm, Patient Position: Sitting, Cuff Size: Normal)   Pulse 78   Temp 97.7 F (36.5 C) (Temporal)   Ht 5\' 6"  (1.676 m)   Wt 149 lb 12.8 oz (67.9 kg)   SpO2 98%   BMI 24.18 kg/m  General:   Alert, jovial, pleasant and cooperative in NAD; he does  stutter ENeck:  Supple; no masses or thyromegaly. No significant cervical adenopathy. Lungs:  Clear throughout to auscultation.   No wheezes, crackles, or rhonchi. No acute distress. Heart:  Regular rate and rhythm; no murmurs, clicks, rubs,  or gallops. Abdomen: Non-distended, normal bowel sounds.  Soft and nontender without appreciable mass or hepatosplenomegaly.  Pulses:  Normal pulses noted. Extremities:  Without clubbing or edema.  Impression/Plan: 66 year old gentleman with longstanding polysubstance abuse with recurrent transfusion dependent iron deficiency anemia presumably secondary to GI blood loss.  Based on GI evaluated last year bleeding from his stomach and proximal small bowel likely related to NSAIDs.  An element of skin of ischemia from cocaine use is not excluded.  Clinically, he seems to be doing much better.  I continue to be concerned about the possibly of ongoing NSAID use.  He takes a "pain medication" at home  Recommendations:  You should stop using illicit drugs as discussed  Please call 73 with the "pain medication" you have at home so we will know what you are taking  CBC, ferritin, iron and iron binding capacity today  Further recommendations to follow.  Office visit here in 3 months     Notice: This dictation was prepared with Dragon dictation along with smaller phrase technology. Any transcriptional errors that result from this process are unintentional and may not be corrected upon review.

## 2021-11-02 NOTE — Patient Instructions (Signed)
It was nice to meet you today!  You should stop using illicit drugs as discussed  Please call us with the "pain medication" you have at home so we will know what you are taking  CBC, ferritin, iron and iron binding capacity today  Further recommendations to follow.  Office visit here in 3 months

## 2021-11-05 ENCOUNTER — Other Ambulatory Visit: Payer: Self-pay

## 2021-11-05 LAB — IRON,TIBC AND FERRITIN PANEL
%SAT: 68 % (calc) — ABNORMAL HIGH (ref 20–48)
Ferritin: 15 ng/mL — ABNORMAL LOW (ref 24–380)
Iron: 291 ug/dL — ABNORMAL HIGH (ref 50–180)
TIBC: 431 mcg/dL (calc) — ABNORMAL HIGH (ref 250–425)

## 2021-11-05 LAB — CBC WITH DIFFERENTIAL/PLATELET
Absolute Monocytes: 415 cells/uL (ref 200–950)
Basophils Absolute: 30 cells/uL (ref 0–200)
Basophils Relative: 1.2 %
Eosinophils Absolute: 288 cells/uL (ref 15–500)
Eosinophils Relative: 11.5 %
HCT: 26.8 % — ABNORMAL LOW (ref 38.5–50.0)
Hemoglobin: 8.7 g/dL — ABNORMAL LOW (ref 13.2–17.1)
Lymphs Abs: 723 cells/uL — ABNORMAL LOW (ref 850–3900)
MCH: 31 pg (ref 27.0–33.0)
MCHC: 32.5 g/dL (ref 32.0–36.0)
MCV: 95.4 fL (ref 80.0–100.0)
MPV: 11.2 fL (ref 7.5–12.5)
Monocytes Relative: 16.6 %
Neutro Abs: 1045 cells/uL — ABNORMAL LOW (ref 1500–7800)
Neutrophils Relative %: 41.8 %
Platelets: 267 10*3/uL (ref 140–400)
RBC: 2.81 10*6/uL — ABNORMAL LOW (ref 4.20–5.80)
RDW: 12.2 % (ref 11.0–15.0)
Total Lymphocyte: 28.9 %
WBC: 2.5 10*3/uL — ABNORMAL LOW (ref 3.8–10.8)

## 2022-02-07 ENCOUNTER — Ambulatory Visit: Payer: Medicare PPO | Admitting: Gastroenterology

## 2022-02-07 NOTE — Progress Notes (Deleted)
GI Office Note    Referring Provider: Emelda Fear, DO Primary Care Physician:  Emelda Fear, DO  Primary Gastroenterologist: Garfield Cornea, MD   Chief Complaint   No chief complaint on file.   History of Present Illness   Cory Elliott is a 66 y.o. 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  Last seen in June 2023.  History of profound IDA presumably secondary to gastric and duodenal erosions/ulcerations in the setting of cocaine and NSAID use.  He has a history of hepatitis C genotype Ia, normal liver elastography, completed course of Epclusa in August 2021, achieving SVR.  EGD and colonoscopy completed in 2022 in [place].  He had a single left-sided diverticulum, esophagitis.  At [facility], he completed capsule endoscopy in August 2022 showing several tiny nonbleeding gastric and small bowel erosions. Recommended CTE but patient did not complete.   He was advised to call us with name of "pain medication" that was prescribed by his other provider.  He did not follow through.  June 2023: White blood cell count 2500, hemoglobin 8.7, hematocrit 26.8, MCV 95.4, platelets 263,000, iron 291, TIBC 431, iron saturation 68%, ferritin 15.  Referral to hematology do not get ordered*** Medications   Current Outpatient Medications  Medication Sig Dispense Refill   docusate sodium (COLACE) 100 MG capsule Take 100 mg by mouth daily.     ferrous sulfate 325 (65 FE) MG tablet FeroSul 325 mg (65 mg iron) tablet  TAKE 1 TABLET BY MOUTH DAILY FOR ANEMIA     Naproxen 375 MG TBEC Take 1 tablet by mouth 2 (two) times daily.     pantoprazole (PROTONIX) 40 MG tablet pantoprazole 40 mg tablet,delayed release     tadalafil (CIALIS) 20 MG tablet Take 1 tablet (20 mg total) by mouth as needed for erectile dysfunction. 10 tablet 6   tamsulosin (FLOMAX) 0.4 MG CAPS  capsule Take 1 capsule (0.4 mg total) by mouth daily after supper. 30 capsule 11   No current facility-administered medications for this visit.    Allergies   Allergies as of 02/07/2022   (No Known Allergies)     Past Medical History   Past Medical History:  Diagnosis Date   Arthritis    Chronic hepatitis C (HCC)    Hepatitis C antibody test positive    Polysubstance abuse [facility])     Past Surgical History   Past Surgical History:  Procedure Laterality Date   GIVENS CAPSULE STUDY N/A 01/07/2021   Procedure: GIVENS CAPSULE STUDY;  Surgeon: Daneil Dolin, MD;  Location: [facility];  Service: Endoscopy;  Laterality: N/A;  7:30am   NO PAST SURGERIES      Past Family History   Family History  Problem Relation Age of Onset   Colon cancer Neg Hx     Past Social History   Social History   Socioeconomic History   Marital status: Single    Spouse name: Not on file   Number of children: Not on file   Years of education: Not on file   Highest education level: Not on file  Occupational History   Not on file  Tobacco Use   Smoking status: Never   Smokeless tobacco: Never  Substance and Sexual Activity   Alcohol use: Yes    Alcohol/week: 4.0 standard drinks of alcohol    Types: 4 Cans of beer per week  Drug use: Yes    Frequency: 2.0 times per week    Types: Marijuana, Cocaine    Comment: 11/02/21 marijuana twice a week; occasional cocaine (once every 2-3 weeks)   Sexual activity: Yes  Other Topics Concern   Not on file  Social History Narrative   Not on file   Social Determinants of Health   Financial Resource Strain: Not on file  Food Insecurity: Not on file  Transportation Needs: Not on file  Physical Activity: Not on file  Stress: Not on file  Social Connections: Not on file  Intimate Partner Violence: Not on file    Review of Systems   General: Negative for anorexia, weight loss, fever, chills, fatigue, weakness. ENT: Negative for hoarseness,  difficulty swallowing , nasal congestion. CV: Negative for chest pain, angina, palpitations, dyspnea on exertion, peripheral edema.  Respiratory: Negative for dyspnea at rest, dyspnea on exertion, cough, sputum, wheezing.  GI: See history of present illness. GU:  Negative for dysuria, hematuria, urinary incontinence, urinary frequency, nocturnal urination.  Endo: Negative for unusual weight change.     Physical Exam   There were no vitals taken for this visit.   General: Well-nourished, well-developed in no acute distress.  Eyes: No icterus. Mouth: Oropharyngeal mucosa moist and pink , no lesions erythema or exudate. Lungs: Clear to auscultation bilaterally.  Heart: Regular rate and rhythm, no murmurs rubs or gallops.  Abdomen: Bowel sounds are normal, nontender, nondistended, no hepatosplenomegaly or masses,  no abdominal bruits or hernia , no rebound or guarding.  Rectal: ***  Extremities: No lower extremity edema. No clubbing or deformities. Neuro: Alert and oriented x 4   Skin: Warm and dry, no jaundice.   Psych: Alert and cooperative, normal mood and affect.  Labs   *** Imaging Studies   No results found.  Assessment       PLAN   ***   Laureen Ochs. Bobby Rumpf, [place], [facility]

## 2022-05-07 ENCOUNTER — Other Ambulatory Visit: Payer: Self-pay | Admitting: Urology

## 2023-10-25 ENCOUNTER — Ambulatory Visit: Admitting: Urology

## 2023-10-25 DIAGNOSIS — R972 Elevated prostate specific antigen [PSA]: Secondary | ICD-10-CM

## 2023-12-08 ENCOUNTER — Ambulatory Visit: Admitting: Urology

## 2023-12-08 DIAGNOSIS — R972 Elevated prostate specific antigen [PSA]: Secondary | ICD-10-CM
